# Patient Record
Sex: Female | Born: 1995 | Race: Black or African American | Hispanic: No | Marital: Single | Smoking: Never smoker
Health system: Southern US, Community
[De-identification: ages and names within clinical notes are randomized; demographics above are authoritative.]

---

## 2016-08-19 ENCOUNTER — Encounter (HOSPITAL_COMMUNITY): Payer: Self-pay | Admitting: *Deleted

## 2016-08-19 ENCOUNTER — Encounter (HOSPITAL_COMMUNITY): Payer: Self-pay

## 2016-08-19 ENCOUNTER — Emergency Department (HOSPITAL_COMMUNITY)
Admission: EM | Admit: 2016-08-19 | Discharge: 2016-08-19 | Disposition: A | Discharge status: Other Acute Inpatient Hospital | Payer: BLUE CROSS/BLUE SHIELD | Attending: Emergency Medicine | Admitting: Emergency Medicine

## 2016-08-19 ENCOUNTER — Inpatient Hospital Stay (HOSPITAL_COMMUNITY)
Admission: RE | Admit: 2016-08-19 | Discharge: 2016-08-22 | DRG: 885 | Disposition: A | Discharge status: Home / Self Care | Payer: BLUE CROSS/BLUE SHIELD | Attending: Psychiatry | Admitting: Psychiatry

## 2016-08-19 DIAGNOSIS — Z818 Family history of other mental and behavioral disorders: Secondary | ICD-10-CM

## 2016-08-19 DIAGNOSIS — T450X2A Poisoning by antiallergic and antiemetic drugs, intentional self-harm, initial encounter: Secondary | ICD-10-CM | POA: Diagnosis present

## 2016-08-19 DIAGNOSIS — F332 Major depressive disorder, recurrent severe without psychotic features: Principal | ICD-10-CM | POA: Diagnosis present

## 2016-08-19 DIAGNOSIS — R45851 Suicidal ideations: Secondary | ICD-10-CM | POA: Diagnosis present

## 2016-08-19 DIAGNOSIS — G4700 Insomnia, unspecified: Secondary | ICD-10-CM | POA: Diagnosis present

## 2016-08-19 DIAGNOSIS — F329 Major depressive disorder, single episode, unspecified: Secondary | ICD-10-CM

## 2016-08-19 DIAGNOSIS — T1491XA Suicide attempt, initial encounter: Secondary | ICD-10-CM | POA: Diagnosis present

## 2016-08-19 DIAGNOSIS — Z79899 Other long term (current) drug therapy: Secondary | ICD-10-CM | POA: Diagnosis not present

## 2016-08-19 LAB — CBC
HCT: 37.3 % (ref 36.0–46.0)
Hemoglobin: 12.2 g/dL (ref 12.0–15.0)
MCH: 27.4 pg (ref 26.0–34.0)
MCHC: 32.7 g/dL (ref 30.0–36.0)
MCV: 83.6 fL (ref 78.0–100.0)
PLATELETS: 264 10*3/uL (ref 150–400)
RBC: 4.46 MIL/uL (ref 3.87–5.11)
RDW: 12.7 % (ref 11.5–15.5)
WBC: 6.8 10*3/uL (ref 4.0–10.5)

## 2016-08-19 LAB — RAPID URINE DRUG SCREEN, HOSP PERFORMED
AMPHETAMINES: NOT DETECTED
Barbiturates: NOT DETECTED
Benzodiazepines: NOT DETECTED
Cocaine: NOT DETECTED
OPIATES: NOT DETECTED
Tetrahydrocannabinol: POSITIVE — AB

## 2016-08-19 LAB — COMPREHENSIVE METABOLIC PANEL
ALT: 11 U/L — AB (ref 14–54)
AST: 16 U/L (ref 15–41)
Albumin: 3.6 g/dL (ref 3.5–5.0)
Alkaline Phosphatase: 37 U/L — ABNORMAL LOW (ref 38–126)
Anion gap: 7 (ref 5–15)
BUN: 5 mg/dL — ABNORMAL LOW (ref 6–20)
CHLORIDE: 107 mmol/L (ref 101–111)
CO2: 25 mmol/L (ref 22–32)
CREATININE: 0.77 mg/dL (ref 0.44–1.00)
Calcium: 9.1 mg/dL (ref 8.9–10.3)
Glucose, Bld: 92 mg/dL (ref 65–99)
POTASSIUM: 4.1 mmol/L (ref 3.5–5.1)
Sodium: 139 mmol/L (ref 135–145)
Total Bilirubin: 0.4 mg/dL (ref 0.3–1.2)
Total Protein: 7 g/dL (ref 6.5–8.1)

## 2016-08-19 LAB — SALICYLATE LEVEL

## 2016-08-19 LAB — ACETAMINOPHEN LEVEL: Acetaminophen (Tylenol), Serum: <10 ug/mL — ABNORMAL LOW (ref 10–30)

## 2016-08-19 LAB — ETHANOL

## 2016-08-19 MED ORDER — MAGNESIUM HYDROXIDE 400 MG/5ML PO SUSP: 30.0000 mL | Freq: Every day | ORAL | Status: DC | PRN

## 2016-08-19 MED ORDER — ALUM & MAG HYDROXIDE-SIMETH 200-200-20 MG/5ML PO SUSP: 30.0000 mL | ORAL | Status: DC | PRN

## 2016-08-19 MED ORDER — SERTRALINE HCL 25 MG PO TABS
25.0000 mg | ORAL_TABLET | Freq: Every day | ORAL | Status: DC
  Administered 2016-08-20 – 2016-08-21 (×2): 25 mg via ORAL
  Filled 2016-08-19 (×4): qty 1

## 2016-08-19 MED ORDER — TRAZODONE HCL 50 MG PO TABS
50.0000 mg | ORAL_TABLET | Freq: Every evening | ORAL | Status: DC | PRN
  Administered 2016-08-19 – 2016-08-21 (×3): 50 mg via ORAL
  Filled 2016-08-19 (×10): qty 1

## 2016-08-19 MED ORDER — HYDROXYZINE HCL 25 MG PO TABS: 25.0000 mg | ORAL_TABLET | Freq: Four times a day (QID) | ORAL | Status: DC | PRN

## 2016-08-19 MED ORDER — ACETAMINOPHEN 325 MG PO TABS
650.0000 mg | ORAL_TABLET | Freq: Four times a day (QID) | ORAL | Status: DC | PRN
  Administered 2016-08-20: 650 mg via ORAL
  Filled 2016-08-19: qty 2

## 2016-08-19 NOTE — BH Assessment (Signed)
Assessment Note  Melinda Levine is an 20 y.o. female that present this date with thoughts of self harm with a plan to overdose. Patient is currently a Consulting civil engineerstudent at [employer] and resides with roommates off campus. Patient was transported this date by her friend/roomate Melinda Levine [phone]. Patient reports they attempted to overdose on Benadryl (patient reports taking 6 pills or more approximately two days ago) in an attempted to end her life. Patient gives consent for roommate that is present, to render collateral information and be present during the assessment. Patient is oriented to time/place but presents with a very depressed affect. Patient finds it difficult to make eye contact with this Clinical research associatewriter and speaks in a low voice. Roommate who is present, states patient has admitted several times to her in the last week that she wanted to end her life. Patient's friend states "she is not herself" and reports patient has been very depressed the last few days, not answering her door or returning calls. Patient admits to ongoing depression for "sometime" but is vague in reference to symptoms. Patient reports her depression to be at a 9 almost every day. Patient admits to increased anxiety and reports she thought she has had panic attacks in the past but never received treatment. Patient stated they were "scary episodes" when they happen. Patient reported they occur several times a month, although could not recall her last episode. Patient often stares at he floor during the assessment and is a poor historian. Patient has to be prompted by friend and this Clinical research associatewriter to answer questions. Patient states she has been very stressed over the last few weeks reporting increased episodes of crying, lack of focus (paying other students to complete her school assignments) and relationship issues with her current partner. Patient stated she hasn't been sleeping more than a few hours a night and "worries about everything." Patient  denies ever receiving any previous inpatient/outpatient treatment for any MH issues and denies any current/past SA issues. Patient denies any prior attempts/jestures self harm but does admitt to cutting herself twice in the last week reporting self inflicted "scratches" to her forearms. Patient stated she has "never" dome that before. Patient was "unsure" if she could contact for safety this date. Patient is open to a voluntary admission, therapy and possible medication management to assist with symptoms of depression. Patient denies any H/I or AVH. Case was staffed with Cobos MD who recommended an inpatient admission.           Diagnosis: MDD without psychotic features, severe GAD  Past Medical History: No past medical history on file.  No past surgical history on file.  Family History: No family history on file.  Social History:  has no tobacco, alcohol, and drug history on file.  Additional Social History:  Alcohol / Drug Use Pain Medications: See MAR Prescriptions: See MAR Over the Counter: See MAR History of alcohol / drug use?: No history of alcohol / drug abuse  CIWA: CIWA-Ar BP: 140/77 Pulse Rate: 82 COWS:    Allergies: Allergies not on file  Home Medications:  (Not in a hospital admission)  OB/GYN Status:  No LMP recorded.  General Assessment Data Location of Assessment[facility] TTS Assessment: In system Is this a Tele or Face-to-Face Assessment?: Face-to-Face Is this an Initial Assessment or a Re-assessment for this encounter?: Initial Assessment Marital status: Single Maiden name: na Is patient pregnant?: No Pregnancy Status: No Living Arrangements: Non-relatives/Friends Can pt return to current living arrangement?: Yes Admission  Status: Voluntary Is patient capable of signing voluntary admission?: Yes Referral Source: Self/Family/Friend Insurance type: Tax adviserBC/BS  Medical Screening Exam [facility] Walk-in ONLY) Medical Exam completed: Yes  Crisis Care  Plan Living Arrangements: Non-relatives/Friends Legal Guardian:  (na) Name of Psychiatrist: None Name of Therapist: None  Education Status Is patient currently in school?: Yes Current Grade:  (2nd year college) 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 : Yes Suicidal Intent: Yes-Currently Present Has patient had any suicidal intent within the past 6 months prior to admission? : Yes Is patient at risk for suicide?: Yes Suicidal Plan?: Yes-Currently Present Has patient had any suicidal plan within the past 6 months prior to admission? : No Specify Current Suicidal Plan: Overdose Access to Means: Yes Specify Access to Suicidal Means: pt has OTC medications What has been your use of drugs/alcohol within the last 12 months?: Denies Previous Attempts/Gestures: No (Hx of cutting) How many times?: 1 Other Self Harm Risks: None noted Triggers for Past Attempts: Unpredictable Intentional Self Injurious Behavior: Cutting Comment - Self Injurious Behavior: pt admitts to superficial cutting Family Suicide History: No Recent stressful life event(s): Other (Comment) (School, Relationship issues) Persecutory voices/beliefs?: No Depression: Yes Depression Symptoms: Loss of interest in usual pleasures, Feeling worthless/self pity Substance abuse history and/or treatment for substance abuse?: No Suicide prevention information given to non-admitted patients: Not applicable  Risk to Others within the past 6 months Homicidal Ideation: No Does patient have any lifetime risk of violence toward others beyond the six months prior to admission? : No Thoughts of Harm to Others: No Current Homicidal Intent: No Current Homicidal Plan: No Access to Homicidal Means: No Identified Victim:  na History of harm to others?: No Assessment of Violence: None Noted Violent Behavior Description: na Does patient have access to weapons?: No Criminal Charges Pending?: No Does patient have a court date: No Is patient on probation?: No  Psychosis Hallucinations: None noted Delusions: None noted  Mental Status Report Appearance/Hygiene: Unremarkable Eye Contact: Poor Motor Activity: Freedom of movement Speech: Soft Level of Consciousness: Quiet/awake Mood: Depressed Affect: Depressed Anxiety Level: Minimal Thought Processes: Coherent, Relevant Judgement: Unimpaired Orientation: Person, Place, Time Obsessive Compulsive Thoughts/Behaviors: None  Cognitive Functioning Concentration: Normal Memory: Recent Intact, Remote Intact IQ: Average Insight: Fair Impulse Control: Poor Appetite: Fair Weight Loss: 0 Weight Gain: 0 Sleep: Decreased Total Hours of Sleep: 5 Vegetative Symptoms: None  ADLScreening [facility] Assessment Services) Patient's cognitive ability adequate to safely complete daily activities?: Yes Patient able to express need for assistance with ADLs?: Yes Independently performs ADLs?: Yes (appropriate for developmental age)  Prior Inpatient Therapy Prior Inpatient Therapy: No Prior Therapy Dates: na Prior Therapy Facilty/Provider(s): na Reason for Treatment: na  Prior Outpatient Therapy Prior Outpatient Therapy: No Prior Therapy Dates: na Prior Therapy Facilty/Provider(s): na Reason for Treatment: na Does patient have an ACCT team?: No Does patient have Intensive In-House Services?  : No Does patient have [facility] services? : No Does patient have P4CC services?: No  ADL Screening (condition at time of admission) Patient's cognitive ability adequate to safely complete daily activities?: Yes Is the patient deaf or have difficulty hearing?: No Does the patient have difficulty seeing, even when wearing glasses/contacts?: No Does the patient have difficulty  concentrating, remembering, or making decisions?: No Patient able to  express need for assistance with ADLs?: Yes Does the patient have difficulty dressing or bathing?: No Independently performs ADLs?: Yes (appropriate for developmental age) Does the patient have difficulty walking or climbing stairs?: No Weakness of Arms/Hands: None  Home Assistive Devices/Equipment Home Assistive Devices/Equipment: None  Therapy Consults (therapy consults require a physician order) PT Evaluation Needed: No OT Evalulation Needed: No SLP Evaluation Needed: No Abuse/Neglect Assessment (Assessment to be complete while patient is alone) Physical Abuse: Denies Verbal Abuse: Denies Sexual Abuse: Denies Exploitation of patient/patient's resources: Denies Self-Neglect: Denies Values / Beliefs Cultural Requests During Hospitalization: None Spiritual Requests During Hospitalization: None Consults Spiritual Care Consult Needed: No Social Work Consult Needed: No Merchant navy officer (For Healthcare) Does Patient Have a Medical Advance Directive?: No Would patient like information on creating a medical advance directive?: No - Patient declined    Additional Information 1:1 In Past 12 Months?: No CIRT Risk: No Elopement Risk: No Does patient have medical clearance?: Yes     Disposition: Case was staffed with Cobos MD who recommended an inpatient admission.           Disposition Initial Assessment Completed for this Encounter: Yes Disposition of Patient: Inpatient treatment program Type of inpatient treatment program: Adult  On Site Evaluation by:   Reviewed with Physician:    Alfredia Ferguson 08/19/2016 2:12 PM

## 2016-08-19 NOTE — Progress Notes (Signed)
20 year old female pt admitted on voluntary basis. Melinda Levine reports feeling depressed and suicidal and did attempt overdose on benadryl recently and also reports having a plan to overdose. Melinda Levine reports having been feeling depressed since grade school but reports she has never really talked to anyone about it until she got into college. She reports that she is not on any medications at present other than an antibiotic that she reports she is taking for a cold. She does endorse marijuana use but denies everything else. She reports that she lives in off campus housing and will plan to go back there after discharge. She reports that she has to learn how to handle negative thoughts. She is able to contract for safety while in the hospital, she was oriented to unit and safety maintained.

## 2016-08-19 NOTE — ED Notes (Signed)
Per Charge patient to be medically cleared- has bed at [facility]

## 2016-08-19 NOTE — BH Assessment (Signed)
[facility] Assessment Progress Note     Case was staffed with Cobos MD who recommended an inpatient admission. [facility]

## 2016-08-19 NOTE — ED Notes (Signed)
Ordered food  

## 2016-08-19 NOTE — ED Notes (Signed)
[facility] and they will not take report until after shift change

## 2016-08-19 NOTE — ED Notes (Signed)
Patient called x3, no response 

## 2016-08-19 NOTE — ED Triage Notes (Signed)
[facility] and they sent her to us for lab work .  After her lab work  [facility].  Pt. Took Benadryl on Saturday with an attempt of hurting herself.  She denies any suicidal thoughts at this time.  She denies any pain or discomfort.

## 2016-08-19 NOTE — ED Provider Notes (Signed)
[facility]-EMERGENCY DEPT Provider Note   CSN: [id number] Arrival date & time: 08/19/16  1450     History   Chief Complaint Chief Complaint  Patient presents with  . Medical Clearance    HPI Melinda Levine is a 20 y.o. female.  [facility]. One week ago, she states she broke up with her boyfriend and has been depressed since then. She also relates problems with stress related to school. 2 days ago, she took 6 diphenhydramine capsules and also scratched her wrists. This was a suicide attempt although she now denies any suicidal ideation. She does relate a history of depression going back to middle school, but she denies prior episodes of suicidal ideation. She admits to crying spells but denies early morning wakening and anhedonia. She had been drinking and using marijuana 2 days ago, but states she normally does not to those. She denies hallucinations. She has not received treatment in the past for her depression.   The history is provided by the patient.    History reviewed. No pertinent past medical history.  There are no active problems to display for this patient.   History reviewed. No pertinent surgical history.  OB History    No data available       Home Medications    Prior to Admission medications   Medication Sig Start Date End Date Taking? Authorizing Provider  azithromycin (ZITHROMAX) 250 MG tablet Take 250 mg by mouth See admin instructions. Take two tablets by mouth on day 1 then 1 tablet by mouth on day 2 through day five. Patient states she has one more tablet left to take as of 08-19-16   Yes Historical Provider, MD  benzonatate (TESSALON) 100 MG capsule Take 100 mg by mouth 2 (two) times daily as needed for cough.    Yes Historical Provider, MD    Family History No family history on file.  Social History Social History  Substance Use Topics  . Smoking status: Never Smoker  . Smokeless  tobacco: Never Used  . Alcohol use No     Allergies   Venlafaxine   Review of Systems Review of Systems  All other systems reviewed and are negative.    Physical Exam Updated Vital Signs BP 131/77 (BP Location: Right Arm)   Pulse 73   Temp 97.9 F (36.6 C) (Oral)   Resp 17   Ht 5\' 4"  (1.626 m)   Wt 170 lb (77.1 kg)   LMP 08/04/2016   SpO2 100%   BMI 29.18 kg/m   Physical Exam  Nursing note and vitals reviewed.  20 year old female, resting comfortably and in no acute distress. Vital signs are Normal. Oxygen saturation is 100%, which is normal. Head is normocephalic and atraumatic. PERRLA, EOMI. Oropharynx is clear. Neck is nontender and supple without adenopathy or JVD. Back is nontender and there is no CVA tenderness. Lungs are clear without rales, wheezes, or rhonchi. Chest is nontender. Heart has regular rate and rhythm without murmur. Abdomen is soft, flat, nontender without masses or hepatosplenomegaly and peristalsis is normoactive. Extremities have no cyanosis or edema, full range of motion is present. Skin is warm and dry without rash. Neurologic: Mental status is normal, cranial nerves are intact, there are no motor or sensory deficits.  ED Treatments / Results  Labs (all labs ordered are listed, but only abnormal results are displayed) Labs Reviewed  COMPREHENSIVE METABOLIC PANEL - Abnormal; Notable for the  following:       Result Value   BUN 5 (*)    ALT 11 (*)    Alkaline Phosphatase 37 (*)    All other components within normal limits  ACETAMINOPHEN LEVEL - Abnormal; Notable for the following:    Acetaminophen (Tylenol), Serum <10 (*)    All other components within normal limits  RAPID URINE DRUG SCREEN, HOSP PERFORMED - Abnormal; Notable for the following:    Tetrahydrocannabinol POSITIVE (*)    All other components within normal limits  ETHANOL  SALICYLATE LEVEL  CBC  POC URINE PREG, ED    Procedures Procedures (including critical care  time)  Medications Ordered in ED Medications - No data to display   Initial Impression / Assessment and Plan / ED Course  I have reviewed the triage vital signs and the nursing notes.  Pertinent lab results that were available during my care of the patient were reviewed by me and considered in my medical decision making (see chart for details).  Clinical Course    Depression with suicidal ideation and suicide attempt with nontoxic dose of medication. She is considered medically cleared at this point, and [facility].  Final Clinical Impressions(s) / ED Diagnoses   Final diagnoses:  Reactive depression  Suicidal ideation    New Prescriptions New Prescriptions   No medications on file     Dione Boozeavid Dasani Thurlow, MD 08/19/16 1851

## 2016-08-19 NOTE — H&P (Signed)
Behavioral Health Medical Screening Exam  Melinda Levine is an 20 y.o. female who presents with her friend due to initiation of self injurious behavior and overdose of benadryl two days ago. The patient denies any previous psychiatric history. Reports recent stress of finals and break up with boyfriend about a week ago. She appears depressed through assessment stating "It's my friend who wanted me to come here. I feel fine now. I am going to quit my job and go stay with my father for a while." Patient has agreed to a voluntary admission to bed 403-1 after receiving medical clearance due to report of recent overdose.   Total Time spent with patient: 20 minutes  Psychiatric Specialty Exam: Physical Exam  Constitutional: She is oriented to person, place, and time. She appears well-developed and well-nourished.  HENT:  Head: Normocephalic and atraumatic.  Right Ear: External ear normal.  Left Ear: External ear normal.  Neck: Normal range of motion. Neck supple.  Cardiovascular: Normal rate, regular rhythm, normal heart sounds and intact distal pulses.   Respiratory: Effort normal and breath sounds normal.  GI: Soft. Bowel sounds are normal.  Musculoskeletal: Normal range of motion.  Neurological: She is alert and oriented to person, place, and time.  Skin: Skin is warm and dry.    Review of Systems  Psychiatric/Behavioral: Positive for depression and suicidal ideas. The patient is nervous/anxious.     Blood pressure 140/77, pulse 82, temperature 98.8 F (37.1 C), resp. rate 18.There is no height or weight on file to calculate BMI.  General Appearance: Casual  Eye Contact:  Good  Speech:  Clear and Coherent and Slow  Volume:  Decreased  Mood:  Depressed  Affect:  Congruent  Thought Process:  Coherent and Goal Directed  Orientation:  Full (Time, Place, and Person)  Thought Content:  Symptoms, worries, concerns   Suicidal Thoughts:  Yes.  without intent/plan  Homicidal Thoughts:  No   Memory:  Immediate;   Good Recent;   Good Remote;   Good  Judgement:  Fair  Insight:  Shallow  Psychomotor Activity:  Decreased  Concentration: Concentration: Fair and Attention Span: Fair  Recall:  [employer]Fair  Fund of Knowledge:Good  Language: Good  Akathisia:  No  Handed:  Right  AIMS (if indicated):     Assets:  Communication Skills Desire for Improvement Financial Resources/Insurance Housing Intimacy Leisure Time Physical Health Resilience Social Support  Sleep:       Musculoskeletal: Strength & Muscle Tone: within normal limits Gait & Station: normal Patient leans: N/A  Blood pressure 140/77, pulse 82, temperature 98.8 F (37.1 C), resp. rate 18.  Recommendations:  Based on my evaluation the patient does not appear to have an emergency medical condition.  Fransisca KaufmannAVIS, Devoiry Corriher, NP 08/19/2016, 2:21 PM

## 2016-08-19 NOTE — Tx Team (Signed)
Initial Treatment Plan 08/19/2016 10:06 PM Melinda Levine [id number]    PATIENT STRESSORS: Educational concerns Other: problems in relationship   PATIENT STRENGTHS: Ability for insight Average or above average intelligence Capable of independent living General fund of knowledge Motivation for treatment/growth   PATIENT IDENTIFIED PROBLEMS: Depression Suicidal thoughts "handling negative thoughts"                     DISCHARGE CRITERIA:  Ability to meet basic life and health needs Improved stabilization in mood, thinking, and/or behavior Verbal commitment to aftercare and medication compliance  PRELIMINARY DISCHARGE PLAN: Attend aftercare/continuing care group Return to previous living arrangement  PATIENT/FAMILY INVOLVEMENT: This treatment plan has been presented to and reviewed with the patient, Melinda Levine, and/or family member, .  The patient and family have been given the opportunity to ask questions and make suggestions.  Melinda Levine, Tennant[place]ok Levine, [place]RN 08/19/2016, 10:06 PM

## 2016-08-19 NOTE — ED Notes (Signed)
Voluntary consent obtained and food given to patient

## 2016-08-19 NOTE — ED Notes (Signed)
Security here to wand patient 

## 2016-08-20 DIAGNOSIS — Z79899 Other long term (current) drug therapy: Secondary | ICD-10-CM

## 2016-08-20 DIAGNOSIS — F332 Major depressive disorder, recurrent severe without psychotic features: Principal | ICD-10-CM

## 2016-08-20 LAB — PREGNANCY, URINE: Preg Test, Ur: NEGATIVE

## 2016-08-20 NOTE — [facility] Group Notes (Signed)
[facility] LCSW Group Therapy  08/20/2016   1:15 PM   Type of Therapy:  Group Therapy  Participation Level:  Active  Participation Quality:  Attentive, Sharing and Supportive  Affect:  Appropriate  Cognitive:  Alert and Oriented  Insight:  Developing/Improving and Engaged  Engagement in Therapy:  Developing/Improving and Engaged  Modes of Intervention:  Clarification, Confrontation, Discussion, Education, Exploration, Limit-setting, Orientation, Problem-solving, Rapport Building, Dance movement psychotherapisteality Testing, Socialization and Support  Summary of Progress/Problems: The topic for group therapy was feelings about diagnosis.  Pt actively participated in group discussion on their past and current diagnosis and how they feel towards this.  Pt also identified how society and family members judge them, based on their diagnosis as well as stereotypes and stigmas.  Patient engaged in a therapeutic letter writing activity expressing her desire to overcome her depression. She states that she has helped to start a support group on campus to educate other students about mental illness.   Samuella BruinKristin Jushua Waltman, MSW, LCSW Clinical Social Worker [facility] [phone]

## 2016-08-20 NOTE — [facility] Suicide Risk Assessment (Signed)
[facility]   Nursing information obtained from:   patient and chart  Demographic factors:   20 year old female , single , college Current Mental Status:   see below Loss Factors:   relationship issues , academic stressors  Historical Factors:   depression Risk Reduction Factors:   resilience   Total Time spent with patient: 45 minutes Principal Problem:  MDD Diagnosis:   Patient Active Problem List   Diagnosis Date Noted  . 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  World Science writerHealth Organization [facility](WHO). Score between 0-7:  no or low risk or alcohol related problems. Score between 8-15:  moderate risk of alcohol related problems. Score between 16-19:  high risk of alcohol related problems. Score 20 or above:  warrants further diagnostic evaluation for alcohol dependence and treatment.   CLINICAL FACTORS:   20 year old single female, college student, recent episode of self cutting and three days ago impulsive overdose on Antihistamine. Presents depressed and endorses neuro-vegetative symptoms of depression    Psychiatric Specialty Exam: Physical Exam  ROS  Blood pressure 114/68, pulse (!) 109, temperature 98.5 F (36.9 C), temperature source Oral, resp. rate 18, height 5' 5.25" (1.657 m), weight 175 lb (79.4 kg), last menstrual period 08/04/2016.Body mass index is 28.9 kg/m.  See admit note MSE   COGNITIVE FEATURES THAT CONTRIBUTE TO RISK:  Decreased level of functioning   SUICIDE RISK:   Moderate:  Frequent suicidal ideation with limited intensity, and duration, some specificity in terms of plans, no associated intent, good self-control, limited dysphoria/symptomatology, some risk factors present, and identifiable  protective factors, including available and accessible social support.   PLAN OF CARE: Patient will be admitted to inpatient psychiatric unit for stabilization and safety. Will provide and encourage milieu participation. Provide medication management and maked adjustments as needed.  Will follow daily.    I certify that inpatient services furnished can reasonably be expected to improve the patient's condition.  Nehemiah MassedOBOS, FERNANDO, MD 08/20/2016, 11:34 AM

## 2016-08-20 NOTE — H&P (Signed)
Psychiatric Admission Assessment Adult  Patient Identification: Melinda Levine MRN:  [id number] Date of Evaluation:  08/20/2016 Chief Complaint:  " I was stressed out" Principal Diagnosis: MDD, Suicide Attempt Diagnosis:   Patient Active Problem List   Diagnosis Date Noted  . MDD (major depressive disorder), recurrent episode, severe ([place]) [F33.2] 08/19/2016   History of Present Illness: 20 year old single female, college student, states she has been struggling with academic and relationship stressors. States she recently broke up with BF and had also been " pretty stressed out about my finals ". She has been having intermittent suicidal ideations and had recently been cutting self ( superficial cuts/scratches to her forearm). Three days ago she impulsively overdosed on six antihistamine tablets ( Benadryl). She states that even though she did not have further suicidal ideations, her friends were very concerned and convinced her to come to ED .  Endorses some neuro-vegetative symptoms as below Associated Signs/Symptoms: Depression Symptoms:  depressed mood, insomnia, suicidal attempt, decreased appetite, decreased sense of self esteem (Hypo) Manic Symptoms:  Denies  Anxiety Symptoms:  She reports she feels she worries excessively about stressors and describes occasional panic attacks triggered bu feeling overwhelmed , denies agoraphobia, denies social anxiety  Psychotic Symptoms: denies  PTSD Symptoms: Denies  Total Time spent with patient: 45 minutes  Past Psychiatric History: no prior psychiatric admissions, no prior suicide attempts, history of self cutting in the past ( one episode earlier this year), denies history of psychosis, denies history of mania, denies history of PTSD, occasional panic attacks . Denies history of violence .  Is the patient at risk to self? Yes.    Has the patient been a risk to self in the past 6 months? Yes.    Has the patient been a risk to self within the  distant past? No.  Is the patient a risk to others? No.  Has the patient been a risk to others in the past 6 months? No.  Has the patient been a risk to others within the distant past? No.   Prior Inpatient Therapy: Prior Inpatient Therapy: No Prior Therapy Dates: na Prior Therapy Facilty/Provider(s): na Reason for Treatment: na Prior Outpatient Therapy: Prior Outpatient Therapy: No Prior Therapy Dates: na Prior Therapy Facilty/Provider(s): na Reason for Treatment: na Does patient have an ACCT team?: No Does patient have Intensive In-House Services?  : No Does patient have [facility] services? : No Does patient have P4CC services?: No  Alcohol Screening: 1. How often do you have a drink containing alcohol?: Monthly or less 2. How many drinks containing alcohol do you have on a typical day when you are drinking?: 1 or 2 3. How often do you have six or more drinks on one occasion?: Never Preliminary Score: 0 9. Have you or someone else been injured as a result of your drinking?: No 10. Has a relative or friend or a doctor or another health worker been concerned about your drinking or suggested you cut down?: No Alcohol Use Disorder Identification Test Final Score (AUDIT): 1 Brief Intervention: AUDIT score less than 7 or less-screening does not suggest unhealthy drinking-brief intervention not indicated Substance Abuse History in the last 12 months:  Denies alcohol abuse, denies drug abuse  Consequences of Substance Abuse: Denies  Previous Psychotropic Medications: states she was prescribed Venlafaxine for migraines and depression, but states it caused  A sense of tingling so she stopped it.  In the past has been on Topamax for migraine prophylaxis. Not on any  medications recently . Psychological Evaluations:  No  Past Medical History:  History of migraines , reports allergy to Venlafaxine.  Family History: Parents alive, separated, has three brothers Family Psychiatric  History: mother  has history of depression, no suicides in family, no alcohol or drug abuse in family  Tobacco Screening: does not smoke or " vape".  Social History: single, no children, Junior in college , lives off campus with roommate, studying Business, employed as a Passenger transport manager, denies legal issues, reports recent relationship stressors with BF but states they have now broken up. History  Alcohol Use No     History  Drug Use  . Types: Marijuana    Additional Social History: Marital status: Single    Pain Medications: See MAR Prescriptions: See MAR Over the Counter: See MAR History of alcohol / drug use?: No history of alcohol / drug abuse  Allergies:   Allergies  Allergen Reactions  . Venlafaxine     Lips swell   Lab Results:  Results for orders placed or performed during the hospital encounter of 08/19/16 (from the past 48 hour(s))  Comprehensive metabolic panel     Status: Abnormal   Collection Time: 08/19/16  3:10 PM  Result Value Ref Range   Sodium 139 135 - 145 mmol/L   Potassium 4.1 3.5 - 5.1 mmol/L   Chloride 107 101 - 111 mmol/L   CO2 25 22 - 32 mmol/L   Glucose, Bld 92 65 - 99 mg/dL   BUN 5 (L) 6 - 20 mg/dL   Creatinine, Ser 0.77 0.44 - 1.00 mg/dL   Calcium 9.1 8.9 - 10.3 mg/dL   Total Protein 7.0 6.5 - 8.1 g/dL   Albumin 3.6 3.5 - 5.0 g/dL   AST 16 15 - 41 U/L   ALT 11 (L) 14 - 54 U/L   Alkaline Phosphatase 37 (L) 38 - 126 U/L   Total Bilirubin 0.4 0.3 - 1.2 mg/dL   GFR calc non Af Amer >60 >60 mL/min   GFR calc Af Amer >60 >60 mL/min    Comment: (NOTE) The eGFR has been calculated using the CKD EPI equation. This calculation has not been validated in all clinical situations. eGFR's persistently <60 mL/min signify possible Chronic Kidney Disease.    Anion gap 7 5 - 15  Ethanol     Status: None   Collection Time: 08/19/16  3:10 PM  Result Value Ref Range   Alcohol, Ethyl (B) <5 <5 mg/dL    Comment:        LOWEST DETECTABLE LIMIT FOR SERUM ALCOHOL IS 5  mg/dL FOR MEDICAL PURPOSES ONLY   Salicylate level     Status: None   Collection Time: 08/19/16  3:10 PM  Result Value Ref Range   Salicylate Lvl <7.5 2.8 - 30.0 mg/dL  Acetaminophen level     Status: Abnormal   Collection Time: 08/19/16  3:10 PM  Result Value Ref Range   Acetaminophen (Tylenol), Serum <10 (L) 10 - 30 ug/mL    Comment:        THERAPEUTIC CONCENTRATIONS VARY SIGNIFICANTLY. A RANGE OF 10-30 ug/mL MAY BE AN EFFECTIVE CONCENTRATION FOR MANY PATIENTS. HOWEVER, SOME ARE BEST TREATED AT CONCENTRATIONS OUTSIDE THIS RANGE. ACETAMINOPHEN CONCENTRATIONS >150 ug/mL AT 4 HOURS AFTER INGESTION AND >50 ug/mL AT 12 HOURS AFTER INGESTION ARE OFTEN ASSOCIATED WITH TOXIC REACTIONS.   cbc     Status: None   Collection Time: 08/19/16  3:10 PM  Result Value Ref Range   WBC  6.8 4.0 - 10.5 K/uL   RBC 4.46 3.87 - 5.11 MIL/uL   Hemoglobin 12.2 12.0 - 15.0 g/dL   HCT 37.3 36.0 - 46.0 %   MCV 83.6 78.0 - 100.0 fL   MCH 27.4 26.0 - 34.0 pg   MCHC 32.7 30.0 - 36.0 g/dL   RDW 12.7 11.5 - 15.5 %   Platelets 264 150 - 400 K/uL  Rapid urine drug screen (hospital performed)     Status: Abnormal   Collection Time: 08/19/16  3:51 PM  Result Value Ref Range   Opiates NONE DETECTED NONE DETECTED   Cocaine NONE DETECTED NONE DETECTED   Benzodiazepines NONE DETECTED NONE DETECTED   Amphetamines NONE DETECTED NONE DETECTED   Tetrahydrocannabinol POSITIVE (A) NONE DETECTED   Barbiturates NONE DETECTED NONE DETECTED    Comment:        DRUG SCREEN FOR MEDICAL PURPOSES ONLY.  IF CONFIRMATION IS NEEDED FOR ANY PURPOSE, NOTIFY LAB WITHIN 5 DAYS.        LOWEST DETECTABLE LIMITS FOR URINE DRUG SCREEN Drug Class       Cutoff (ng/mL) Amphetamine      1000 Barbiturate      200 Benzodiazepine   301 Tricyclics       601 Opiates          300 Cocaine          300 THC              50     Blood Alcohol level:  Lab Results  Component Value Date   ETH <5 09/32/3557    Metabolic Disorder  Labs:  No results found for: HGBA1C, MPG No results found for: PROLACTIN No results found for: CHOL, TRIG, HDL, CHOLHDL, VLDL, LDLCALC  Current Medications: Current Facility-Administered Medications  Medication Dose Route Frequency Provider Last Rate Last Dose  . acetaminophen (TYLENOL) tablet 650 mg  650 mg Oral Q6H PRN Laverle Hobby, PA-C      . alum & mag hydroxide-simeth (MAALOX/MYLANTA) 200-200-20 MG/5ML suspension 30 mL  30 mL Oral Q4H PRN Laverle Hobby, PA-C      . hydrOXYzine (ATARAX/VISTARIL) tablet 25 mg  25 mg Oral Q6H PRN Laverle Hobby, PA-C      . magnesium hydroxide (MILK OF MAGNESIA) suspension 30 mL  30 mL Oral Daily PRN Laverle Hobby, PA-C      . sertraline (ZOLOFT) tablet 25 mg  25 mg Oral Daily Laverle Hobby, PA-C   25 mg at 08/20/16 0834  . traZODone (DESYREL) tablet 50 mg  50 mg Oral QHS,MR X 1 Laverle Hobby, PA-C   50 mg at 08/19/16 2143   PTA Medications: Prescriptions Prior to Admission  Medication Sig Dispense Refill Last Dose  . azithromycin (ZITHROMAX) 250 MG tablet Take 250 mg by mouth See admin instructions. Take two tablets by mouth on day 1 then 1 tablet by mouth on day 2 through day five. Patient states she has one more tablet left to take as of 08-19-16   08/19/2016 at Unknown time  . benzonatate (TESSALON) 100 MG capsule Take 100 mg by mouth 2 (two) times daily as needed for cough.    08/19/2016 at Unknown time    Musculoskeletal: Strength & Muscle Tone: within normal limits Gait & Station: normal Patient leans: N/A  Psychiatric Specialty Exam: Physical Exam  Review of Systems  Constitutional: Negative.   HENT: Negative.   Eyes: Negative.   Respiratory: Negative.   Cardiovascular: Negative.  Gastrointestinal: Negative.   Genitourinary: Negative.   Musculoskeletal: Negative.   Skin: Negative.   Neurological: Positive for headaches.       History of migraines   Endo/Heme/Allergies: Negative.   Psychiatric/Behavioral: Positive for  depression and suicidal ideas.  All other systems reviewed and are negative.   Blood pressure 114/68, pulse (!) 109, temperature 98.5 F (36.9 C), temperature source Oral, resp. rate 18, height 5' 5.25" (1.657 m), weight 175 lb (79.4 kg), last menstrual period 08/04/2016.Body mass index is 28.9 kg/m.  General Appearance: Well Groomed  Eye Contact:  Good  Speech:  Normal Rate  Volume:  Decreased  Mood:  minimizes depression at this time, but does appear sad   Affect:  constricted, but reactive , smiles at times appropriately   Thought Process:  Linear  Orientation:  Full (Time, Place, and Person)  Thought Content:  denies hallucinations,no delusions  Suicidal Thoughts:  No denies any suicidal or self injurious ideations, contracts for safety on the unit, denies any homicidal or violent ideations   Homicidal Thoughts:  No  Memory:  recent and remote grossly intact  Judgement:  Other:  improved   Insight:  Fair  Psychomotor Activity:  Normal  Concentration:  Concentration: Good and Attention Span: Good  Recall:  Good  Fund of Knowledge:  Good  Language:  Good  Akathisia:  Negative  Handed:  Right  AIMS (if indicated):     Assets:  Desire for Improvement Resilience  ADL's:  Intact  Cognition:  WNL  Sleep:  Number of Hours: 6.75    Treatment Plan Summary: Daily contact with patient to assess and evaluate symptoms and progress in treatment, Medication management, Plan inpatient admission and medications as below  Observation Level/Precautions:  15 minute checks  Laboratory:  as needed - TSH, Urine Pregnancy Test   Psychotherapy:  Milieu, group therapy  Medications:  Has been started on Zoloft , 25 mgrs QDAY . We discussed side effects and rationale, have reviewed potential increased risk of suicidal ideations early in treatment with antidepressants in young adults .  Consultations:  As needed  Discharge Concerns:  -  Estimated LOS: 4-5 days  Other:     Physician Treatment  Plan for Primary Diagnosis:  MDD, Suicide Attempt Long Term Goal(s): Improvement in symptoms so as ready for discharge  Short Term Goals: Ability to verbalize feelings will improve, Ability to disclose and discuss suicidal ideas, Ability to demonstrate self-control will improve and Ability to identify and develop effective coping behaviors will improve  Physician Treatment Plan for Secondary Diagnosis: Active Problems:   MDD (major depressive disorder), recurrent episode, severe ([place])  Long Term Goal(s): Improvement in symptoms so as ready for discharge  Short Term Goals: Ability to verbalize feelings will improve, Ability to disclose and discuss suicidal ideas, Ability to demonstrate self-control will improve and Ability to identify and develop effective coping behaviors will improve  I certify that inpatient services furnished can reasonably be expected to improve the patient's condition.    Neita Garnet, MD 12/5/20179:11 AM

## 2016-08-20 NOTE — Progress Notes (Signed)
Recreation Therapy Notes  Animal-Assisted Activity (AAA) Program Checklist/Progress Notes Patient Eligibility Criteria Checklist & Daily Group note for Rec TxIntervention  Date: 12.05.2017 Time: 2:45pm Location: [address] Dayroom    AAA/T Program Assumption of Risk Form signed by Patient/ or Parent Legal Guardian Yes  Patient is free of allergies or sever asthma Yes  Patient reports no fear of animals Yes  Patient reports no history of cruelty to animals Yes  Patient understands his/her participation is voluntary Yes  Patient washes hands before animal contact Yes  Patient washes hands after animal contact Yes  Behavioral Response: Appropriate   Education:Hand Washing, Appropriate Animal Interaction   Education Outcome: Acknowledges education.   Clinical Observations/Feedback: Patient attended session and interacted appropriately with therapy dog and peers.   Hamdi Vari L Taylorann Tkach, LRT/CTRS        Dezerae Freiberger L 08/20/2016 3:04 PM 

## 2016-08-20 NOTE — Tx Team (Signed)
Interdisciplinary Treatment and Diagnostic Plan Update  08/20/2016 Time of Session: 9:30am Melinda Levine MRN: [id number]  Principal Diagnosis: MDD (major depressive disorder), recurrent episode, severe (HCC)  Current Medications:  Current Facility-Administered Medications  Medication Dose Route Frequency Provider Last Rate Last Dose  . acetaminophen (TYLENOL) tablet 650 mg  650 mg Oral Q6H PRN Kerry HoughSpencer E Simon, PA-C      . alum & mag hydroxide-simeth (MAALOX/MYLANTA) 200-200-20 MG/5ML suspension 30 mL  30 mL Oral Q4H PRN Kerry HoughSpencer E Simon, PA-C      . hydrOXYzine (ATARAX/VISTARIL) tablet 25 mg  25 mg Oral Q6H PRN Kerry HoughSpencer E Simon, PA-C      . magnesium hydroxide (MILK OF MAGNESIA) suspension 30 mL  30 mL Oral Daily PRN Kerry HoughSpencer E Simon, PA-C      . sertraline (ZOLOFT) tablet 25 mg  25 mg Oral Daily Kerry HoughSpencer E Simon, PA-C   25 mg at 08/20/16 0834  . traZODone (DESYREL) tablet 50 mg  50 mg Oral QHS,MR X 1 Kerry HoughSpencer E Simon, PA-C   50 mg at 08/19/16 2143   PTA Medications: Prescriptions Prior to Admission  Medication Sig Dispense Refill Last Dose  . azithromycin (ZITHROMAX) 250 MG tablet Take 250 mg by mouth See admin instructions. Take two tablets by mouth on day 1 then 1 tablet by mouth on day 2 through day five. Patient states she has one more tablet left to take as of 08-19-16   08/19/2016 at Unknown time  . benzonatate (TESSALON) 100 MG capsule Take 100 mg by mouth 2 (two) times daily as needed for cough.    08/19/2016 at Unknown time    Patient Stressors: Educational concerns Other: problems in relationship  Patient Strengths: Ability for insight Average or above average intelligence Capable of independent living [employer] knowledge Motivation for treatment/growth  Treatment Modalities: Medication Management, Group therapy, Case management,  1 to 1 session with clinician, Psychoeducation, Recreational therapy.   Physician Treatment Plan for Primary Diagnosis: MDD (major depressive  disorder), recurrent episode, severe (HCC) Long Term Goal(s): Improvement in symptoms so as ready for discharge Improvement in symptoms so as ready for discharge   Short Term Goals: Ability to verbalize feelings will improve Ability to disclose and discuss suicidal ideas Ability to demonstrate self-control will improve Ability to identify and develop effective coping behaviors will improve Ability to verbalize feelings will improve Ability to disclose and discuss suicidal ideas Ability to demonstrate self-control will improve Ability to identify and develop effective coping behaviors will improve  Medication Management: Evaluate patient's response, side effects, and tolerance of medication regimen.  Therapeutic Interventions: 1 to 1 sessions, Unit Group sessions and Medication administration.  Evaluation of Outcomes: Progressing   RN Treatment Plan for Primary Diagnosis: MDD (major depressive disorder), recurrent episode, severe (HCC) Long Term Goal(s): Knowledge of disease and therapeutic regimen to maintain health will improve  Short Term Goals: Ability to remain free from injury will improve, Ability to disclose and discuss suicidal ideas, Ability to identify and develop effective coping behaviors will improve and Compliance with prescribed medications will improve  Medication Management: RN will administer medications as ordered by provider, will assess and evaluate patient's response and provide education to patient for prescribed medication. RN will report any adverse and/or side effects to prescribing provider.  Therapeutic Interventions: 1 on 1 counseling sessions, Psychoeducation, Medication administration, Evaluate responses to treatment, Monitor vital signs and CBGs as ordered, Perform/monitor CIWA, COWS, AIMS and Fall Risk screenings as ordered, Perform wound care treatments as  ordered.  Evaluation of Outcomes: Progressing   LCSW Treatment Plan for Primary Diagnosis: MDD  (major depressive disorder), recurrent episode, severe (HCC) Long Term Goal(s): Safe transition to appropriate next level of care at discharge, Engage patient in therapeutic group addressing interpersonal concerns.  Short Term Goals: Engage patient in aftercare planning with referrals and resources, Increase social support, Increase emotional regulation, Identify triggers associated with mental health/substance abuse issues and Increase skills for wellness and recovery  Therapeutic Interventions: Assess for all discharge needs, 1 to 1 time with Social worker, Explore available resources and support systems, Assess for adequacy in community support network, Educate family and significant other(s) on suicide prevention, Complete Psychosocial Assessment, Interpersonal group therapy.  Evaluation of Outcomes: Progressing   Progress in Treatment :  Attending groups: Continuing to assess  Participating in groups: Continuing to assess  Taking medication as prescribed: Yes, MD continuing to assess for appropriate medication regimen  Toleration medication: Yes  Family/Significant other contact made: Treatment team assessing for appropriate contacts  Patient understands diagnosis: Yes  Discussing patient identified problems/goals with staff: Yes  Medical problems stabilized or resolved: Yes  Denies suicidal/homicidal ideation: Yes, denies  Issues/concerns per patient self-inventory: None reported  Other: N/A  New problem(s) identified: None reported at this time    New Short Term/Long Term Goal(s): None at this time    Discharge Plan or Barriers: Patient plans to return home to follow up with outpatient services.     Reason for Continuation of Hospitalization: Anxiety Depression Medication stabilization Suicidal Ideations Withdrawal symptoms  Estimated Length of Stay: 1-2 days    Attendees:  Patient:   Physician: Dr. Jama Flavorsobos, Dr. Mckinley Jewelates, MD  08/20/2016   9:30am  Nursing:  Shelia MediaJanet Jones, Quintella ReichertBeverly Knight, Odette FractionCaroline Beaudry, Karen [facility] Campushugart 08/20/2016 9:30am  RN Care Manager: Onnie BoerJennifer Clark, CM  08/20/2016 9:30am  Social Workers: Chad CordialLauren Carter, LCSW, Samuella BruinKristin Rozell Theiler, LCSW, Heather Smart, LCSW   08/20/2016 9:30am  Nurse Pratictioners: Gray BernhardtMay Augustin, NP, NP 08/20/2016 9:30am  Other:  08/20/2016 9:30am   Scribe for Treatment Team: Samuella BruinKristin Yurem Viner, LCSW Clinical Social Worker [facility] [phone]

## 2016-08-20 NOTE — [facility] Group Notes (Signed)
Orientation group:  Patient listened attentively.  She is concerned about her exams.  She states she needs a letter upon discharge.  She hopes to speak with the social worker about calling the school to let them know she is in the hospital.

## 2016-08-20 NOTE — Progress Notes (Signed)
Adult Psychoeducational Group Note  Date:  08/20/2016 Time:  11:27 PM  Group Topic/Focus:  Wrap-Up Group:   The focus of this group is to help patients review their daily goal of treatment and discuss progress on daily workbooks.   Participation Level:  Active  Participation Quality:  Appropriate  Affect:  Appropriate  Cognitive:  Alert  Insight: Appropriate  Engagement in Group:  Engaged  Modes of Intervention:  Discussion  Additional Comments:  Patient states, "My day was good". Patient stated that she wrote a letter to depression today. Patient's goal for today was to process her negative thoughts better.  Renelle Stegenga L Lakshmi Sundeen 08/20/2016, 11:27 PM

## 2016-08-20 NOTE — [facility] Suicide Risk Assessment (Signed)
[facility] INPATIENT:  Family/Significant Other Suicide Prevention Education  Suicide Prevention Education:  Contact Attempts: mother Aldean Jewettera Taybron [phone], (name of family member/significant other) has been identified by the patient as the family member/significant other with whom the patient will be residing, and identified as the person(s) who will aid the patient in the event of a mental health crisis.  With written consent from the patient, two attempts were made to provide suicide prevention education, prior to and/or following the patient's discharge.  We were unsuccessful in providing suicide prevention education.  A suicide education pamphlet was given to the patient to share with family/significant other.  Date and time of first attempt: 08/20/16 at 5pm Date and time of second attempt: 08/21/16 at 11:30am  Collyn Ribas L Kiwanna Spraker 08/20/2016, 5:04 PM

## 2016-08-20 NOTE — [facility] Counselor (Signed)
Adult Comprehensive Assessment  Patient ID: Melinda Levine, female   DOB: 12/24/1995, 20 y.o.   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 Family Relationships: Gets along well with family but feels compelled to financially and emotionally support them Financial / Lack of resources (include bankruptcy): Some financial stress due to sending money back home to help her family Housing / Lack of housing: Lives in off-campus apartment with a roommate who she gets along well with Physical health (include injuries & life threatening diseases): Frequent migraines Social relationships: Denies Substance abuse: Denies Bereavement / Loss: Break up last week of a 3 yr relationship  Living/Environment/Situation:  Living Arrangements: Non-relatives/Friends Living conditions (as described by patient or guardian): Lives in off-campus apartment with a roommate who she gets along well with How long has patient lived in current situation?: August 2017 What is atmosphere in current home: Comfortable  Family History:  Marital status: Single Does patient have children?: No  Childhood History:  By whom was/is the patient raised?: Both parents Description of patient's relationship with caregiver when they were a child: Got along well with both parents Patient's description of current relationship with people who raised him/her: Gets along well with family but feels compelled to financially and emotionally support them Does patient have siblings?: Yes Number of Siblings: 3 Description of patient's current relationship with siblings: good with 3 brothers Did patient suffer any verbal/emotional/physical/sexual abuse as a child?: No Did patient suffer from  severe childhood neglect?: No Has patient ever been sexually abused/assaulted/raped as an adolescent or adult?: No Was the patient ever a victim of a crime or a disaster?: No Witnessed domestic violence?: No Has patient been effected by domestic violence as an adult?: No  Education:  Highest grade of school patient has completed: Holiday representativeJunior at [employer] studying business.  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 Patient's job has been impacted by current illness: No What is the longest time patient has a held a job?: current job Has patient ever been in the [employer] and Companymilitary?: No  Financial Resources:   Surveyor, quantityinancial resources: Income from employment Does patient have a representative payee or guardian?: No  Alcohol/Substance Abuse:   What has been your use of drugs/alcohol within the last 12 months?: Denies If attempted suicide, did drugs/alcohol play a role in this?: No Alcohol/Substance Abuse Treatment Hx: Denies past history Has alcohol/substance abuse ever caused legal problems?: No  Social Support System:   Patient's Community Support System: Good Describe Community Support System: friends and family Type of faith/religion: Ephri[employer] KnucklesChristian How does patient's faith help to cope with current illness?: prays often  Leisure/Recreation:   Leisure and Hobbies: Research officer, trade unionstyling hair, watching beauty videos online  Strengths/Needs:   What things does the patient do well?: good sense of humor, supporting others, styling hair In what areas does patient struggle / problems for patient: depression, anxiety,  dealing with recent break up, not doing well in classes  Discharge Plan:  Does patient have access to transportation?: Yes Will patient be returning to same living situation after discharge?: Yes Currently receiving community mental health services: Yes (From Whom) ([facility]- does not want to return) If no, would patient like referral for services when discharged?: Yes (What county?) ([place] or surrounding counties) Does patient have financial barriers related to discharge medications?: No  Summary/Recommendations:     Patient is a 20 year old female who presented to the hospital with an intentional overdose of medications. Primary triggers for admission include increased depression, declining academic performance, and a recent breakup. Patient will benefit from crisis stabilization, medication evaluation, group therapy and psycho education in addition to case management for discharge planning. At discharge, it is recommended that Pt remain compliant with established discharge plan and continued treatment.   Jye Fariss L Juliannah Ohmann. 08/20/2016

## 2016-08-21 LAB — TSH: TSH: 1.922 u[IU]/mL (ref 0.350–4.500)

## 2016-08-21 MED ORDER — MENTHOL 3 MG MT LOZG: 1.0000 | LOZENGE | OROMUCOSAL | Status: DC | PRN

## 2016-08-21 MED ORDER — SERTRALINE HCL 50 MG PO TABS
50.0000 mg | ORAL_TABLET | Freq: Every day | ORAL | Status: DC
  Administered 2016-08-22: 50 mg via ORAL
  Filled 2016-08-21 (×4): qty 1

## 2016-08-21 NOTE — [facility] Group Notes (Signed)
[facility] LCSW Group Therapy 08/21/2016  1:15 PM Type of Therapy: Group Therapy Participation Level: Active  Participation Quality: Attentive, Sharing and Supportive  Affect: Appropriate  Cognitive: Alert and Oriented  Insight: Developing/Improving and Engaged  Engagement in Therapy: Developing/Improving and Engaged  Modes of Intervention: Clarification, Confrontation, Discussion, Education, Exploration, Limit-setting, Orientation, Problem-solving, Rapport Building, Dance movement psychotherapisteality Testing, Socialization and Support  Summary of Progress/Problems: The topic for group today was emotional regulation. This group focused on both positive and negative emotion identification and allowed group members to process ways to identify feelings, regulate negative emotions, and find healthy ways to manage internal/external emotions. Group members were asked to reflect on a time when their reaction to an emotion led to a negative outcome and explored how alternative responses using emotion regulation would have benefited them. Group members were also asked to discuss a time when emotion regulation was utilized when a negative emotion was experienced. Patient shared that she finds comfort in a stuffed animal that her grandmother gave her, stating that it makes her think of being carefree when she was younger.   Samuella BruinKristin Ezra Denne, MSW, LCSW Clinical Social Worker [facility] [phone]

## 2016-08-21 NOTE — Progress Notes (Signed)
Patient ID: Melinda Levine, female   DOB: 03/15/1996, 20 y.o.   MRN: [id number]  DAR: Pt. Denies SI/HI and A/V Hallucinations. She reports sleep is good, appetite is good, energy level is normal, and concentration is good. She rates depression 0/10, hopelessness 0/10, and anxiety 2/10. Patient does not report any pain or discomfort at this time. Support and encouragement provided to the patient. Scheduled medication administered to patient per physician's order. Patient is minimal with Clinical research associatewriter but is seen in the milieu interacting with peers and is attending groups. She is cooperative and plan of care continues. Q15 minute checks are maintained for safety.

## 2016-08-21 NOTE — Progress Notes (Signed)
Pt reports she is doing much better today, and denies SI/HI/AVH.  She is hopeful that she will be discharged soon so that she can get back to school ([employer]) because of semester exams.  She has been pleasant and cooperative on the unit.  She has been observed interacting with other patients.  Support and encouragement offered.  Discharge plans are in process.  Safety maintained with q15 minute checks.

## 2016-08-21 NOTE — Progress Notes (Signed)
Pt reports she is doing fine this evening.  She has been observed most of the evening in the dayroom talking with a female peer close to her age.  She denies SI/HI/AVH.  She is eager to discharge so that she can get back to school.  Pt voices no needs or concerns at this time.  She makes her needs known to staff.  She has been pleasant and cooperative.  Support and encouragement offered.  Discharge plans are in process.  Safety maintained with q15 minute checks.

## 2016-08-21 NOTE — Progress Notes (Signed)
[facility] MD Progress Note  08/21/2016 4:55 PM Melinda Levine  MRN:  [id number] Subjective:  Patient reports improving mood, and describes feeling " better". At this time does not endorse medication side effects. Objective : I have discussed case with treatment team and have met with patient . Patient reports ongoing depression, but states she is feeling better,and does present with improved , brighter range of affect. No medication side effects at this time. Denies any suicidal or self injurious ideations at this time. Visible on the unit, more interactive/ sociable  with peers of about her age  No agitation or restlessness . Patient states she is less focused on recent break up at this time, and states " I am getting over it", she does report worrying significantly about her academic performance and grades , and states this time of year ( final exams ) causes increased tension and anxiety. Labs - Pregnancy test negative , TSH WNL  Principal Problem: MDD (major depressive disorder), recurrent episode, severe (HCC) Diagnosis:   Patient Active Problem List   Diagnosis Date Noted  . MDD (major depressive disorder), recurrent episode, severe (HCC) [F33.2] 08/19/2016   Total Time spent with patient: 20 minutes   Past Medical History: History reviewed. No pertinent past medical history. History reviewed. No pertinent surgical history. Family History: History reviewed. No pertinent family history.  Social History:  History  Alcohol Use No     History  Drug Use  . Types: Marijuana    Social History   Social History  . Marital status: Single    Spouse name: N/A  . Number of children: N/A  . Years of education: N/A   Social History Main Topics  . Smoking status: Never Smoker  . Smokeless tobacco: Never Used  . Alcohol use No  . Drug use:     Types: Marijuana  . Sexual activity: Not Asked   Other Topics Concern  . None   Social History Narrative  . None   Additional Social  History:    Pain Medications: See MAR Prescriptions: See MAR Over the Counter: See MAR History of alcohol / drug use?: No history of alcohol / drug abuse  Sleep: Good  Appetite:  Good  Current Medications: Current Facility-Administered Medications  Medication Dose Route Frequency Provider Last Rate Last Dose  . acetaminophen (TYLENOL) tablet 650 mg  650 mg Oral Q6H PRN Spencer E Simon, PA-C   650 mg at 08/20/16 1536  . alum & mag hydroxide-simeth (MAALOX/MYLANTA) 200-200-20 MG/5ML suspension 30 mL  30 mL Oral Q4H PRN Spencer E Simon, PA-C      . hydrOXYzine (ATARAX/VISTARIL) tablet 25 mg  25 mg Oral Q6H PRN Spencer E Simon, PA-C      . magnesium hydroxide (MILK OF MAGNESIA) suspension 30 mL  30 mL Oral Daily PRN Spencer E Simon, PA-C      . menthol-cetylpyridinium (CEPACOL) lozenge 3 mg  1 lozenge Oral PRN Agnes I Nwoko, NP      . sertraline (ZOLOFT) tablet 25 mg  25 mg Oral Daily Spencer E Simon, PA-C   25 mg at 08/21/16 0756  . traZODone (DESYREL) tablet 50 mg  50 mg Oral QHS,MR X 1 Spencer E Simon, PA-C   50 mg at 08/20/16 2105    Lab Results:  Results for orders placed or performed during the hospital encounter of 08/19/16 (from the past 48 hour(s))  Pregnancy, urine     Status: None   Collection Time: 08/20/16  9:39 AM    Result Value Ref Range   Preg Test, Ur NEGATIVE NEGATIVE    Comment:        THE SENSITIVITY OF THIS METHODOLOGY IS >20 mIU/mL. Performed at [facility]   TSH     Status: None   Collection Time: 08/21/16  6:03 AM  Result Value Ref Range   TSH 1.922 0.350 - 4.500 uIU/mL    Comment: Performed by a 3rd Generation assay with a functional sensitivity of <=0.01 uIU/mL. [facility]     Blood Alcohol level:  Lab Results  Component Value Date   ETH <5 08/19/2016    Metabolic Disorder Labs: No results found for: HGBA1C, MPG No results found for: PROLACTIN No results found for: CHOL, TRIG, HDL, CHOLHDL,  VLDL, LDLCALC  Physical Findings: AIMS: Facial and Oral Movements Muscles of Facial Expression: None, normal Lips and Perioral Area: None, normal Jaw: None, normal Tongue: None, normal,Extremity Movements Upper (arms, wrists, hands, fingers): None, normal Lower (legs, knees, ankles, toes): None, normal, Trunk Movements Neck, shoulders, hips: None, normal, Overall Severity Severity of abnormal movements (highest score from questions above): None, normal Incapacitation due to abnormal movements: None, normal Patient's awareness of abnormal movements (rate only patient's report): No Awareness, Dental Status Current problems with teeth and/or dentures?: No Does patient usually wear dentures?: No  CIWA:    COWS:     Musculoskeletal: Strength & Muscle Tone: within normal limits Gait & Station: normal Patient leans: N/A  Psychiatric Specialty Exam: Physical Exam  ROS denies headache, no nausea or vomiting   Blood pressure 111/84, pulse 83, temperature 98.4 F (36.9 C), temperature source Oral, resp. rate 16, height 5' 5.25" (1.657 m), weight 175 lb (79.4 kg), last menstrual period 08/04/2016.Body mass index is 28.9 kg/m.  General Appearance: Well Groomed  Eye Contact:  Good  Speech:  Normal Rate  Volume:  Normal  Mood:  partially improved, less depressed   Affect:  more reactive, less constricted  Thought Process:  Linear  Orientation:  Full (Time, Place, and Person)  Thought Content:  no hallucinations,no delusions, not internally preoccupied   Suicidal Thoughts:  No  Homicidal Thoughts:  No  Memory:  recent and remote grossly intact   Judgement:  Other:  improving   Insight:  improving   Psychomotor Activity:  Normal  Concentration:  Concentration: Good and Attention Span: Good  Recall:  Good  Fund of Knowledge:  Good  Language:  Good  Akathisia:  Negative  Handed:  Right  AIMS (if indicated):     Assets:  Desire for Improvement Resilience  ADL's:  Intact  Cognition:   WNL  Sleep:  Number of Hours: 6   Assessment - patient is improving compared to admission, less depressed, and more reactive affect, although  remains vaguely constricted .Denies any ongoing suicidal or self injurious ideations, and is more interactive , visible in milieu. Thus far tolerating Zoloft trial well .    Treatment Plan Summary: Daily contact with patient to assess and evaluate symptoms and progress in treatment, Medication management, Plan inpatient admission  and medications as below Encourage ongoing group and milieu participation to work on coping skills and symptom reduction Increase Zoloft to 50 mgrs QDAY for depression, anxiety Continue Vistaril 25 mgrs Q 6 hours PRN for anxiety  Continue Trazodone 50 mgrs QHS PRN for insomnia Treatment team working on disposition planning options  COBOS, FERNANDO, MD 08/21/2016, 4:55 PM 

## 2016-08-21 NOTE — [facility] Suicide Risk Assessment (Signed)
[facility] INPATIENT:  Family/Significant Other Suicide Prevention Education  Suicide Prevention Education:  Education Completed; mother Melinda Levine [phone],  (name of family member/significant other) has been identified by the patient as the family member/significant other with whom the patient will be residing, and identified as the person(s) who will aid the patient in the event of a mental health crisis (suicidal ideations/suicide attempt).  With written consent from the patient, the family member/significant other has been provided the following suicide prevention education, prior to the and/or following the discharge of the patient.  The suicide prevention education provided includes the following:  Suicide risk factors  Suicide prevention and interventions  National Suicide Hotline telephone number  [facility] assessment telephone number  [facility] Emergency Assistance 911  [facility] and/or [facility]idential Mobile Crisis Unit telephone number  Request made of family/significant other to:  Remove weapons (e.g., guns, rifles, knives), all items previously/currently identified as safety concern.    Remove drugs/medications (over-the-counter, prescriptions, illicit drugs), all items previously/currently identified as a safety concern.  The family member/significant other verbalizes understanding of the suicide prevention education information provided.  The family member/significant other agrees to remove the items of safety concern listed above.  Melinda Levine 08/21/2016, 4:13 PM

## 2016-08-21 NOTE — Progress Notes (Signed)
Recreation Therapy Notes  Date: 08/21/16 Time: 0930 Location: 300 Dayroom  Group Topic: Stress Management  Goal Area(s) Addresses:  Patient will verbalize importance of using healthy stress management.  Patient will identify positive emotions associated with healthy stress management.   Behavioral Response: Engaged  Intervention: Stress Management  Activity :  Guided Imagery.  LRT introduced the stress management technique of guided imagery to the patients.  LRT read a script to allow patients to envision their peaceful place.  Patients were to follow allow with the script to engage in the activity.    Education:  Stress Management, Discharge Planning.   Education Outcome: Acknowledges edcuation/In group clarification offered/Needs additional education  Clinical Observations/Feedback: Pt attended group.   Caroll RancherMarjette Kaianna Dolezal, LRT/CTRS         Caroll RancherLindsay, Adah Stoneberg A 08/21/2016 12:30 PM

## 2016-08-21 NOTE — Progress Notes (Signed)
Adult Psychoeducational Group Note  Date:  08/21/2016 Time:  8:37 PM  Group Topic/Focus:  Wrap-Up Group:   The focus of this group is to help patients review their daily goal of treatment and discuss progress on daily workbooks.   Participation Level:  Active  Participation Quality:  Appropriate and Attentive  Affect:  Appropriate  Cognitive:  Appropriate  Insight: Appropriate and Good  Engagement in Group:  Engaged  Modes of Intervention:  Discussion  Additional Comments:  Pt stated her goal for today was to find things to do after discharge. Pt stated she wrote in her journal things that she wanted to accomplish such as getting things with her classes straight since she missed her final exams, and using her coping skills upon discharge. Pt stated something positive for her today is she is hoping to discharge tomorrow. Pt was encouraged to make her needs known to staff. Caswell CorwinOwen, Jarred Purtee C 08/21/2016, 8:37 PM

## 2016-08-22 MED ORDER — SERTRALINE HCL 50 MG PO TABS: 50.0000 mg | ORAL_TABLET | Freq: Every day | ORAL | 0 refills | Status: AC

## 2016-08-22 MED ORDER — TRAZODONE HCL 50 MG PO TABS: 50.0000 mg | ORAL_TABLET | Freq: Every evening | ORAL | 0 refills | Status: AC | PRN

## 2016-08-22 MED ORDER — HYDROXYZINE HCL 25 MG PO TABS: 25.0000 mg | ORAL_TABLET | Freq: Four times a day (QID) | ORAL | 0 refills | Status: AC | PRN

## 2016-08-22 NOTE — Discharge Summary (Signed)
Physician Discharge Summary Note  Patient:  Melinda Levine is an 20 y.o., female MRN:  [id number] DOB:  03/03/1996 Patient phone:  [phone] (home)  Patient address:   [address] [place] [address],  Total Time spent with patient: 30 minutes  Date of Admission:  08/19/2016 Date of Discharge: 08/22/2016  Reason for Admission:    Principal Problem: Severe episode of recurrent major depressive disorder, without psychotic features [facility](HCC) Discharge Diagnoses: Patient Active Problem List   Diagnosis Date Noted  . Severe episode of recurrent major depressive disorder, without psychotic features (HCC) [F33.2] 08/19/2016    Past Psychiatric History: see HPI   Past Medical History: History reviewed. No pertinent past medical history. History reviewed. No pertinent surgical history. Family History: History reviewed. No pertinent family history. Family Psychiatric  History:  See HPI Social History:  History  Alcohol Use No     History  Drug Use  . Types: Marijuana    Social History   Social History  . Marital status: Single    Spouse name: N/A  . Number of children: N/A  . Years of education: N/A   Social History Main Topics  . Smoking status: Never Smoker  . Smokeless tobacco: Never Used  . Alcohol use No  . Drug use:     Types: Marijuana  . Sexual activity: Not Asked   Other Topics Concern  . None   Social History Narrative  . 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  She reports that her stressors included breaking up with boyfriend, and struggling with academic studies.  She overdosed on 6 Benadryl pills.  Her friends were concerned about her and encouraged her to seek help.  Melinda Levine was admitted for Severe episode of recurrent major depressive disorder, without psychotic features (HCC) and crisis management.  Patient was treated with medications with their indications  listed below in detail under Medication List.  Medical problems were identified and treated as needed.  Home medications were restarted as appropriate.  Improvement was monitored by observation and Melinda Levine daily report of symptom reduction.  Emotional and mental status was monitored by daily self inventory reports completed by Melinda Levine and clinical staff.  Patient reported continued improvement, denied any new concerns.  Patient had been compliant on medications and denied side effects.  Support and encouragement was provided.    Melinda Levine was evaluated by the treatment team for stability and plans for continued recovery upon discharge.  Patient was offered further treatment options upon discharge including Residential, Intensive Outpatient and Outpatient treatment. Patient will follow up with agency listed below for medication management and counseling.  Encouraged patient to maintain satisfactory support network and home environment.  Advised to adhere to medication compliance and outpatient treatment follow up.  Prescriptions provided.       Melinda Levine motivation was an integral factor for scheduling further treatment.  Employment, transportation, bed availability, health status, family support, and any pending legal issues were also considered during patient's hospital stay.  Upon completion of this admission the patient was both mentally and medically stable for discharge denying suicidal/homicidal ideation, auditory/visual/tactile hallucinations, delusional thoughts and paranoia.      Physical Findings: AIMS: Facial and Oral Movements Muscles of Facial Expression: None, normal Lips and Perioral Area: None, normal Jaw: None, normal Tongue: None, normal,Extremity Movements Upper (arms, wrists, hands, fingers): None, normal Lower (legs, knees, ankles, toes): None, normal, Trunk  Movements Neck, shoulders, hips: None, normal, Overall Severity Severity of abnormal movements (highest score  from questions above): None, normal Incapacitation due to abnormal movements: None, normal Patient's awareness of abnormal movements (rate only patient's report): No Awareness, Dental Status Current problems with teeth and/or dentures?: No Does patient usually wear dentures?: No  CIWA:    COWS:     Musculoskeletal: Strength & Muscle Tone: within normal limits Gait & Station: normal Patient leans: N/A  Psychiatric Specialty Exam: Physical Exam  Nursing note and vitals reviewed. Psychiatric: She has a normal mood and affect. Her speech is normal and behavior is normal. Judgment and thought content normal. Cognition and memory are normal.    Review of Systems  Constitutional: Negative.   HENT: Negative.   Eyes: Negative.   Respiratory: Negative.   Cardiovascular: Negative.   Gastrointestinal: Negative.   Genitourinary: Negative.   Musculoskeletal: Negative.   Skin: Negative.   Neurological: Negative.   Endo/Heme/Allergies: Negative.   Psychiatric/Behavioral: Negative.     Blood pressure 105/65, pulse (!) 122, temperature 98.4 F (36.9 C), temperature source Oral, resp. rate 16, height 5' 5.25" (1.657 m), weight 79.4 kg (175 lb), last menstrual period 08/04/2016.Body mass index is 28.9 kg/m.   Have you used any form of tobacco in the last 30 days? (Cigarettes, Smokeless Tobacco, Cigars, and/or Pipes): No  Has this patient used any form of tobacco in the last 30 days? (Cigarettes, Smokeless Tobacco, Cigars, and/or Pipes) Yes, N/A  Blood Alcohol level:  Lab Results  Component Value Date   ETH <5 08/19/2016    Metabolic Disorder Labs:  No results found for: HGBA1C, MPG No results found for: PROLACTIN No results found for: CHOL, TRIG, HDL, CHOLHDL, VLDL, LDLCALC  See Psychiatric Specialty Exam and Suicide Risk Assessment completed by Attending Physician prior to discharge.  Discharge destination:  Home  Is patient on multiple antipsychotic therapies at discharge:  No    Has Patient had three or more failed trials of antipsychotic monotherapy by history:  No  Recommended Plan for Multiple Antipsychotic Therapies: NA     Medication List    STOP taking these medications   azithromycin 250 MG tablet Commonly known as:  ZITHROMAX   benzonatate 100 MG capsule Commonly known as:  TESSALON     TAKE these medications     Indication  hydrOXYzine 25 MG tablet Commonly known as:  ATARAX/VISTARIL Take 1 tablet (25 mg total) by mouth every 6 (six) hours as needed for anxiety.  Indication:  Anxiety Neurosis   sertraline 50 MG tablet Commonly known as:  ZOLOFT Take 1 tablet (50 mg total) by mouth daily. Start taking on:  08/23/2016  Indication:  Major Depressive Disorder   traZODone 50 MG tablet Commonly known as:  DESYREL Take 1 tablet (50 mg total) by mouth at bedtime and may repeat dose one time if needed.  Indication:  Major Depressive Disorder      Follow-up Information    [facility] Follow up.   Why:  Therapy appt with Jari Pigg on Thursday 12/14 at 9am. Medication managment appt with Milana Obey at 2:45pm. Please bring insurance card to appt and call office if you need to reschedule.  Contact information: [address] [place], [place] [address]  [phone]          Follow-up recommendations:  Activity:  as tol Diet:  as tol  Comments:  1.  Take all your medications as prescribed.   2.  Report any adverse  side effects to outpatient provider. 3.  Patient instructed to not use alcohol or illegal drugs while on prescription medicines. 4.  In the event of worsening symptoms, instructed patient to call 911, the crisis hotline or go to nearest emergency room for evaluation of symptoms.  Signed: Lindwood QuaSheila May Agustin, NP [facility] 08/22/2016, 2:15 PM   Patient seen, Suicide Assessment Completed.  Disposition Plan Reviewed

## 2016-08-22 NOTE — Tx Team (Signed)
Interdisciplinary Treatment and Diagnostic Plan Update  08/22/2016 Time of Session: 9:30am Melinda Levine MRN: [id number]  Principal Diagnosis: MDD (major depressive disorder), recurrent episode, severe (HCC)  Current Medications:  Current Facility-Administered Medications  Medication Dose Route Frequency Provider Last Rate Last Dose  . acetaminophen (TYLENOL) tablet 650 mg  650 mg Oral Q6H PRN Kerry HoughSpencer E Simon, PA-C   650 mg at 08/20/16 1536  . alum & mag hydroxide-simeth (MAALOX/MYLANTA) 200-200-20 MG/5ML suspension 30 mL  30 mL Oral Q4H PRN Kerry HoughSpencer E Simon, PA-C      . hydrOXYzine (ATARAX/VISTARIL) tablet 25 mg  25 mg Oral Q6H PRN Kerry HoughSpencer E Simon, PA-C      . magnesium hydroxide (MILK OF MAGNESIA) suspension 30 mL  30 mL Oral Daily PRN Kerry HoughSpencer E Simon, PA-C      . menthol-cetylpyridinium (CEPACOL) lozenge 3 mg  1 lozenge Oral PRN Sanjuana KavaAgnes I Nwoko, NP      . sertraline (ZOLOFT) tablet 50 mg  50 mg Oral Daily Craige CottaFernando A Cobos, MD   50 mg at 08/22/16 0747  . traZODone (DESYREL) tablet 50 mg  50 mg Oral QHS,MR X 1 Kerry HoughSpencer E Simon, PA-C   50 mg at 08/21/16 2133   PTA Medications: Prescriptions Prior to Admission  Medication Sig Dispense Refill Last Dose  . azithromycin (ZITHROMAX) 250 MG tablet Take 250 mg by mouth See admin instructions. Take two tablets by mouth on day 1 then 1 tablet by mouth on day 2 through day five. Patient states she has one more tablet left to take as of 08-19-16   08/19/2016 at Unknown time  . benzonatate (TESSALON) 100 MG capsule Take 100 mg by mouth 2 (two) times daily as needed for cough.    08/19/2016 at Unknown time    Patient Stressors: Educational concerns Other: problems in relationship  Patient Strengths: Ability for insight Average or above average intelligence Capable of independent living [employer] Motivation for treatment/growth  Treatment Modalities: Medication Management, Group therapy, Case management,  1 to 1 session with clinician,  Psychoeducation, Recreational therapy.   Physician Treatment Plan for Primary Diagnosis: MDD (major depressive disorder), recurrent episode, severe (HCC) Long Term Goal(s): Improvement in symptoms so as ready for discharge Improvement in symptoms so as ready for discharge   Short Term Goals: Ability to verbalize feelings will improve Ability to disclose and discuss suicidal ideas Ability to demonstrate self-control will improve Ability to identify and develop effective coping behaviors will improve Ability to verbalize feelings will improve Ability to disclose and discuss suicidal ideas Ability to demonstrate self-control will improve Ability to identify and develop effective coping behaviors will improve  Medication Management: Evaluate patient's response, side effects, and tolerance of medication regimen.  Therapeutic Interventions: 1 to 1 sessions, Unit Group sessions and Medication administration.  Evaluation of Outcomes: Adequate for Discharge   RN Treatment Plan for Primary Diagnosis: MDD (major depressive disorder), recurrent episode, severe (HCC) Long Term Goal(s): Knowledge of disease and therapeutic regimen to maintain health will improve  Short Term Goals: Ability to remain free from injury will improve, Ability to disclose and discuss suicidal ideas, Ability to identify and develop effective coping behaviors will improve and Compliance with prescribed medications will improve  Medication Management: RN will administer medications as ordered by provider, will assess and evaluate patient's response and provide education to patient for prescribed medication. RN will report any adverse and/or side effects to prescribing provider.  Therapeutic Interventions: 1 on 1 counseling sessions, Psychoeducation, Medication administration, Evaluate  responses to treatment, Monitor vital signs and CBGs as ordered, Perform/monitor CIWA, COWS, AIMS and Fall Risk screenings as ordered, Perform  wound care treatments as ordered.  Evaluation of Outcomes: Adequate for Discharge   LCSW Treatment Plan for Primary Diagnosis: MDD (major depressive disorder), recurrent episode, severe (HCC) Long Term Goal(s): Safe transition to appropriate next level of care at discharge, Engage patient in therapeutic group addressing interpersonal concerns.  Short Term Goals: Engage patient in aftercare planning with referrals and resources, Increase social support, Increase emotional regulation, Identify triggers associated with mental health/substance abuse issues and Increase skills for wellness and recovery  Therapeutic Interventions: Assess for all discharge needs, 1 to 1 time with Social worker, Explore available resources and support systems, Assess for adequacy in community support network, Educate family and significant other(s) on suicide prevention, Complete Psychosocial Assessment, Interpersonal group therapy.  Evaluation of Outcomes: Adequate for Discharge   Progress in Treatment :  Attending groups: Yes  Participating in groups: Yes  Taking medication as prescribed: Yes, MD continuing to assess for appropriate medication regimen  Toleration medication: Yes  Family/Significant other contact made: Yes, CSW has spoken with mother  Patient understands diagnosis: Yes  Discussing patient identified problems/goals with staff: Yes  Medical problems stabilized or resolved: Yes  Denies suicidal/homicidal ideation: Yes, denies  Issues/concerns per patient self-inventory: None reported  Other: N/A  New problem(s) identified: None reported at this time    New Short Term/Long Term Goal(s): None at this time    Discharge Plan or Barriers: Patient plans to return home to follow up with outpatient services.     Reason for Continuation of Hospitalization: Anxiety Depression Medication stabilization Suicidal Ideations Withdrawal symptoms  Estimated Length of Stay: Discharge  anticipated for today 08/22/16    Attendees:  Patient:   Physician: Dr. Mckinley Jewelates, Dr. Jama Flavorsobos, MD  08/22/2016   9:30am  Nursing: Caryl AspJan Wright, Karen Shugart, Joslyn Devonaroline Beaudry, RN 08/22/2016 9:30am  RN Care Manager: Onnie BoerJennifer Clark, CM  08/22/2016 9:30am  Social Workers: Chad CordialLauren Carter, LCSW, Samuella BruinKristin Johsua Shevlin, LCSW, [place] Cross[place]ather Smart, LCSW   08/22/2016 9:30am  Nurse Pratictioners: Gray BernhardtMay Augustin, NP, Armandina StammerAgnes Nwoko, NP 08/22/2016 9:30am  Other:  08/22/2016 9:30am    Scribe for Treatment Team: Samuella BruinKristin Jeniece Hannis, LCSW Clinical Social Worker [facility] [phone]

## 2016-08-22 NOTE — [facility] Suicide Risk Assessment (Signed)
[facility]   Principal Problem: MDD (major depressive disorder), recurrent episode, severe (HCC) Discharge Diagnoses:  Patient Active Problem List   Diagnosis Date Noted  . MDD (major depressive disorder), recurrent episode, severe (HCC) [F33.2] 08/19/2016    Total Time spent with patient: 30 minutes  Musculoskeletal: Strength & Muscle Tone: within normal limits Gait & Station: normal Patient leans: N/A  Psychiatric Specialty Exam: ROS  Blood pressure 105/65, pulse (!) 122, temperature 98.4 F (36.9 C), temperature source Oral, resp. rate 16, height 5' 5.25" (1.657 m), weight 175 lb (79.4 kg), last menstrual period 08/04/2016.Body mass index is 28.9 kg/m.  General Appearance: Well Groomed  Eye Contact::  Good  Speech:  Normal Rate409  Volume:  Normal  Mood:  improved and currently minimizes depression  Affect:  Appropriate and reactive   Thought Process:  Linear  Orientation:  Full (Time, Place, and Person)  Thought Content:  no hallucinations, no delusions   Suicidal Thoughts:  No  Homicidal Thoughts:  No  Memory:  recent and remote grossly intact  Judgement:  Other:  improved   Insight:  improved  Psychomotor Activity:  Normal  Concentration:  Good  Recall:  Good  Fund of Knowledge:Good  Language: Good  Akathisia:  Negative  Handed:  Right  AIMS (if indicated):     Assets:  Communication Skills Resilience  Sleep:  Number of Hours: 6.75  Cognition: WNL  ADL's:  Intact   Mental Status Per Nursing Assessment::   On Admission:     Demographic Factors:  20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, Archivistcollege student, 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   Loss Factors: Recent break up with BF, academic pressure  Historical Factors: History of episodes of self cutting, history of depression, no prior psychiatric admissions, no prior suicide attempts   Risk Reduction Factors:   Sense of responsibility to family, Living with another person, especially a relative and  Positive coping skills or problem solving skills  Continued Clinical Symptoms:  At this time patient is improved compared to admission, with improved mood and fuller range of affect, no thought disorder, no suicidal or self injurious ideations, no homicidal ideations, no hallucinations, no delusions, future oriented. We have reviewed medication side effects- denies side effects at this time- aware of potential increased risk of suicidal ideations early in treatment with antidepressants in young adults Behavior on unit calm  Cognitive Features That Contribute To Risk:  No gross cognitive deficits noted upon discharge. Is alert , attentive, and oriented x 3   Suicide Risk:  Mild:  Suicidal ideation of limited frequency, intensity, duration, and specificity.  There are no identifiable plans, no associated intent, mild dysphoria and related symptoms, good self-control (both objective and subjective assessment), few other risk factors, and identifiable protective factors, including available and accessible social support.  Follow-up Information    [facility] Follow up.   Why:  Therapy appt with Jari PiggKelly Joyce on Thursday 12/14 at 9am. Medication managment appt with Milana Obeyeresa Francis at 2:45pm. Please bring insurance card to appt and call office if you need to reschedule.  Contact information: [address] [place], [place] [address]  [phone]          Plan Of Care/Follow-up recommendations:  Activity:  as tolerated  Diet:  regular Tests:  NA Other:  see below  Patient is leaving unit in good spirits Follow up as above   Nehemiah MassedOBOS, FERNANDO, MD 08/22/2016, 12:46 PM

## 2016-08-22 NOTE — Progress Notes (Signed)
Discharge note:  Patient discharged home per MD order.  Patient received all personal belongings from locker and unit.  Patient will follow up with the [facility].  Reviewed AVS/transition record with patient and she indicated understanding.  Patient denies any thoughts of self harm.  Patient left ambulatory and in good spirits.  She had her own transportation here.

## 2016-08-22 NOTE — Progress Notes (Signed)
  [facility] :  Will you be returning to the same living situation after discharge:  Yes,  patient plans to return home At discharge, do you have transportation home?: Yes,  friend/family will pick up Do you have the ability to pay for your medications: Yes,  patient will be provided with prescriptions at discharge  Release of information consent forms completed and in the chart;  Patient's signature needed at discharge.  Patient to Follow up at: Follow-up Information    [facility] Follow up.   Why:  Therapy appt with Jari PiggKelly Joyce on Thursday 12/14 at 9am. Medication managment appt with Milana Obeyeresa Francis at 2:45pm. Please bring insurance card to appt and call office if you need to reschedule.  Contact information: [address] [place], [place] [address]  [phone]          Next level of care provider has [facility] Link:no  Safety Planning and Suicide Prevention discussed: Yes,  with patient and mother  Have you used any form of tobacco in the last 30 days? (Cigarettes, Smokeless Tobacco, Cigars, and/or Pipes): No  Has patient been referred to the Quitline?: N/A patient is not a smoker  Patient has been referred for addiction treatment: Yes  Melinda Levine 08/22/2016, 10:31 AM

## 2016-12-04 ENCOUNTER — Other Ambulatory Visit: Payer: Self-pay | Admitting: Family

## 2016-12-04 DIAGNOSIS — R1011 Right upper quadrant pain: Secondary | ICD-10-CM

## 2016-12-10 ENCOUNTER — Other Ambulatory Visit: Payer: Self-pay | Admitting: Family

## 2016-12-12 ENCOUNTER — Other Ambulatory Visit: Payer: Self-pay

## 2016-12-13 ENCOUNTER — Ambulatory Visit
Admission: RE | Admit: 2016-12-13 | Discharge: 2016-12-13 | Disposition: A | Discharge status: Home / Self Care | Payer: BLUE CROSS/BLUE SHIELD | Source: Ambulatory Visit | Attending: Family | Admitting: Family

## 2016-12-13 DIAGNOSIS — R1011 Right upper quadrant pain: Secondary | ICD-10-CM

## 2017-11-03 IMAGING — US US ABDOMEN LIMITED
1 series · 14 of 25 positions shown · non-contrast
Comparison: None.

CLINICAL DATA: Right upper quadrant abdominal pain.

EXAM:
US ABDOMEN LIMITED - RIGHT UPPER QUADRANT

[Series 1: us abdomen limited · 0.22mm/px · 14 of 46 slices shown]
[im 1/46]
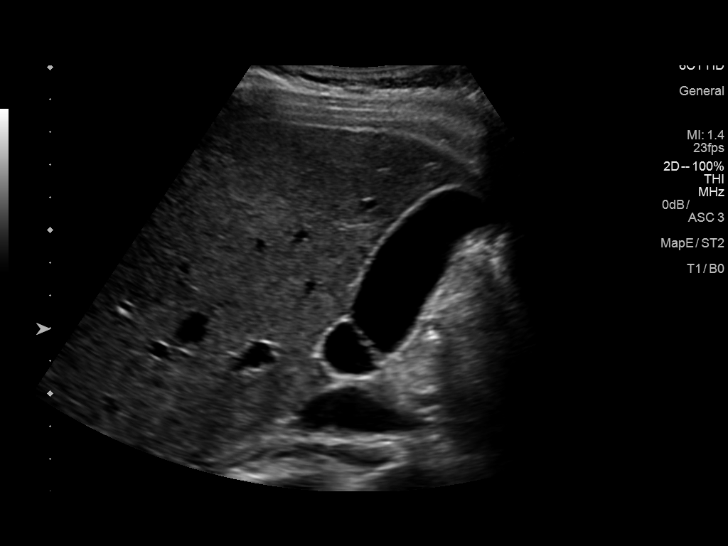
[im 4/46]
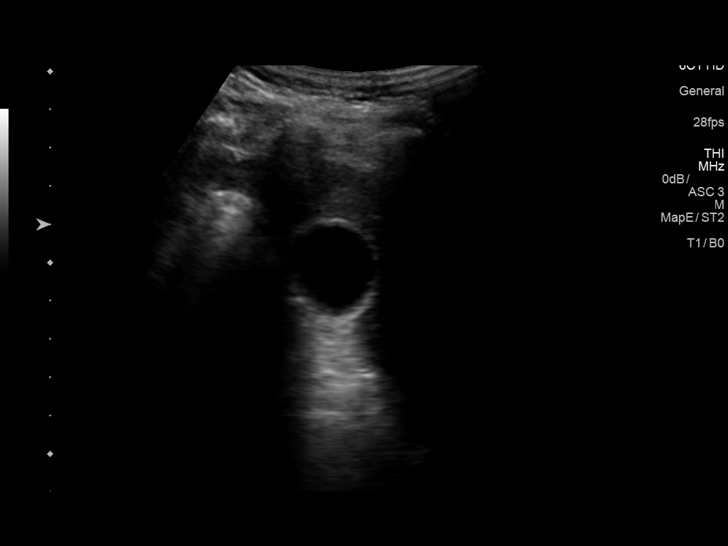
[im 8/46]
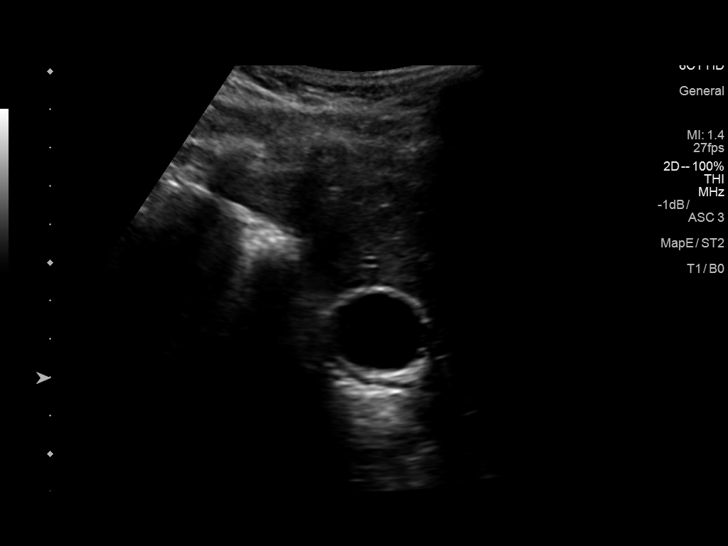
[im 12/46]
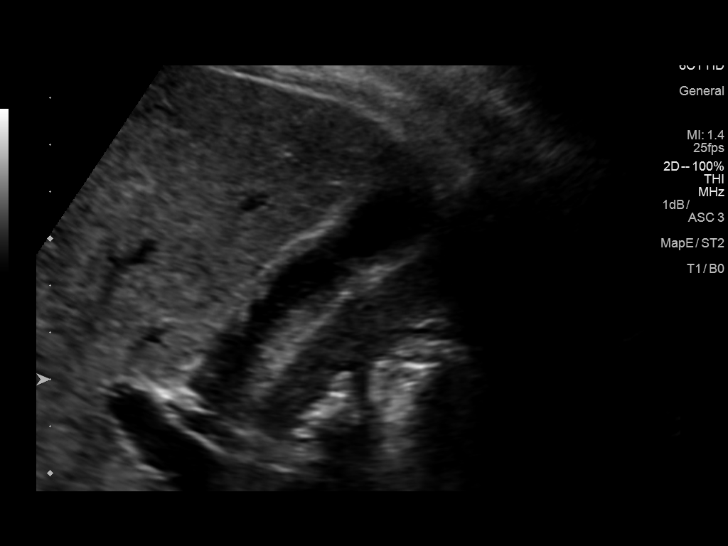
[im 16/46]
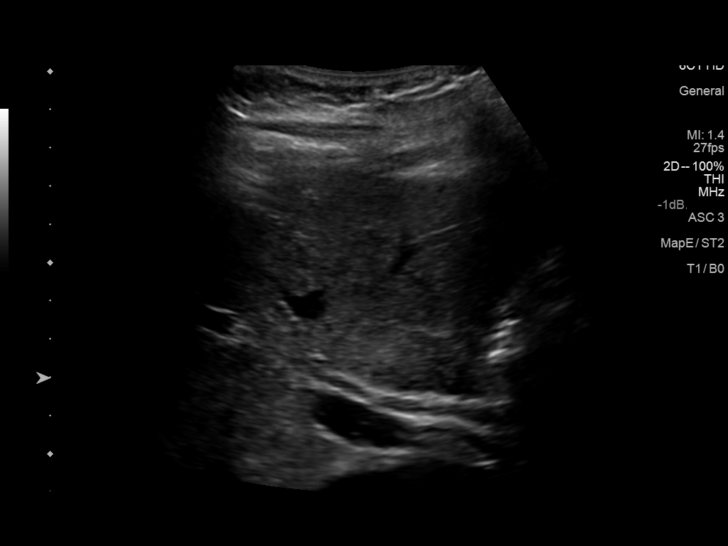
[im 17/46]
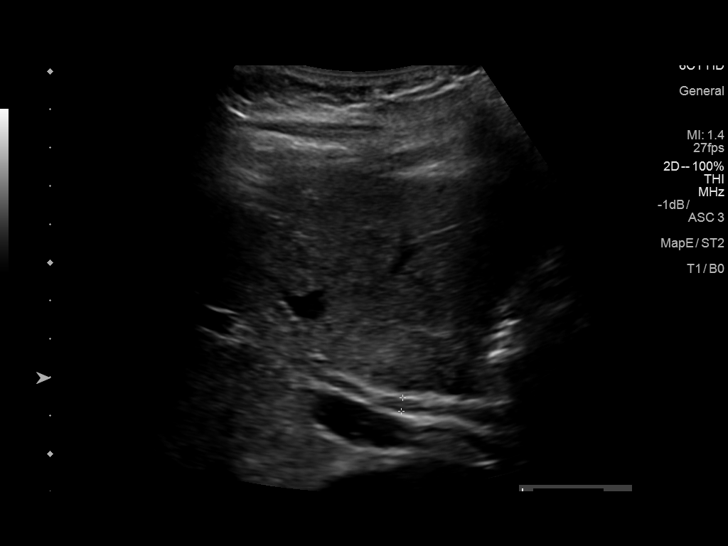
[im 21/46]
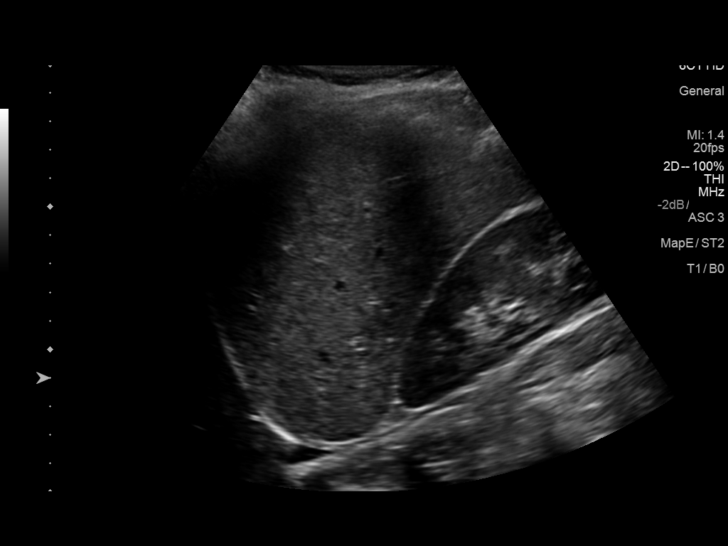
[im 25/46]
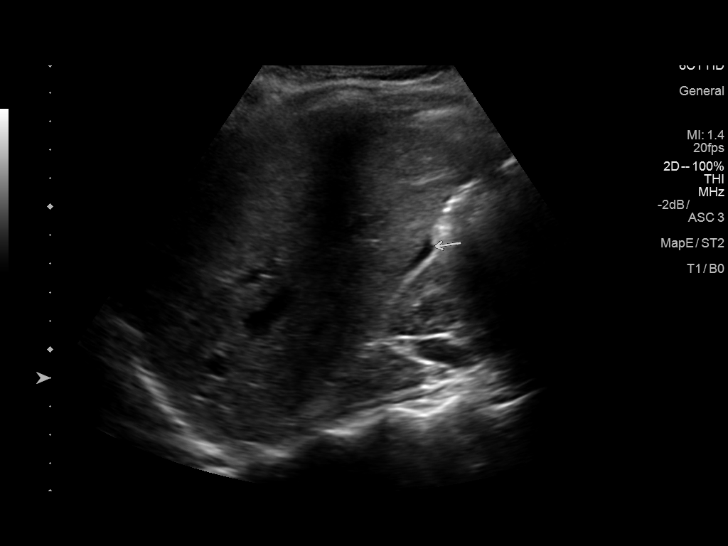
[im 29/46]
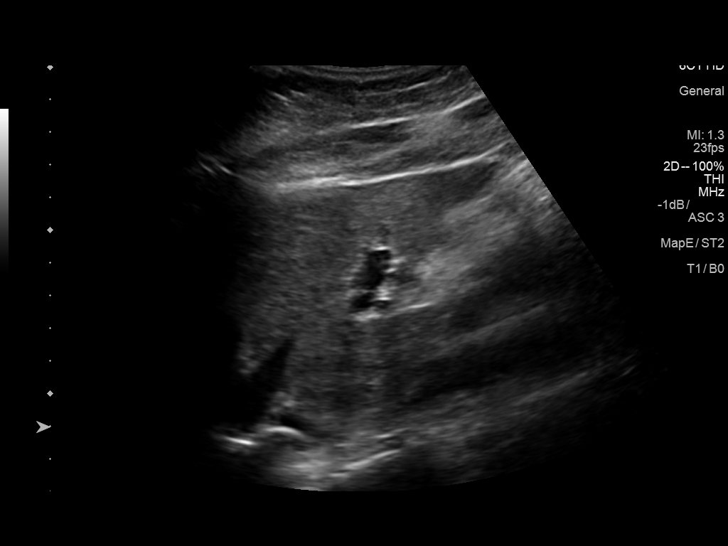
[im 31/46]
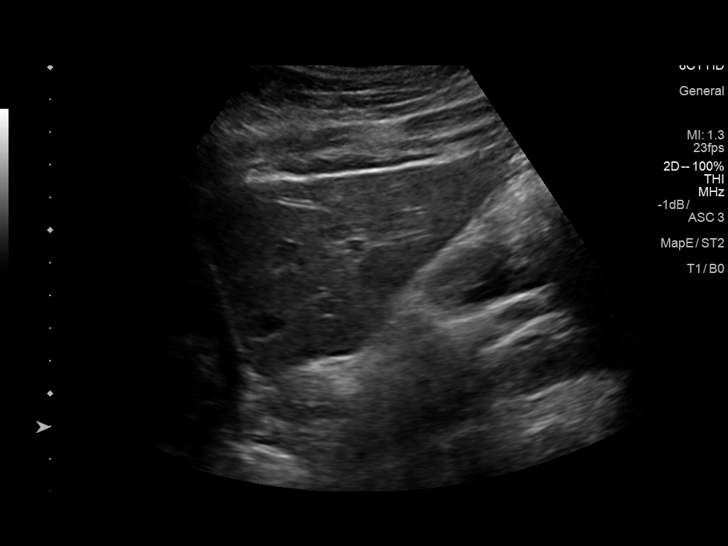
[im 34/46]
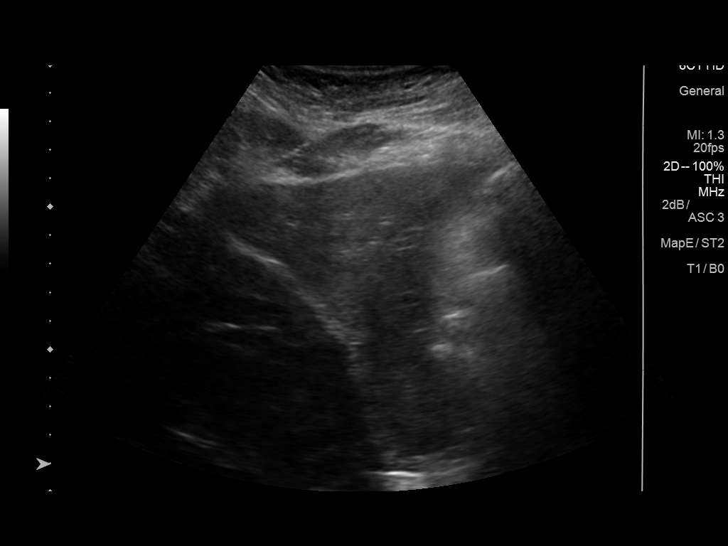
[im 38/46]
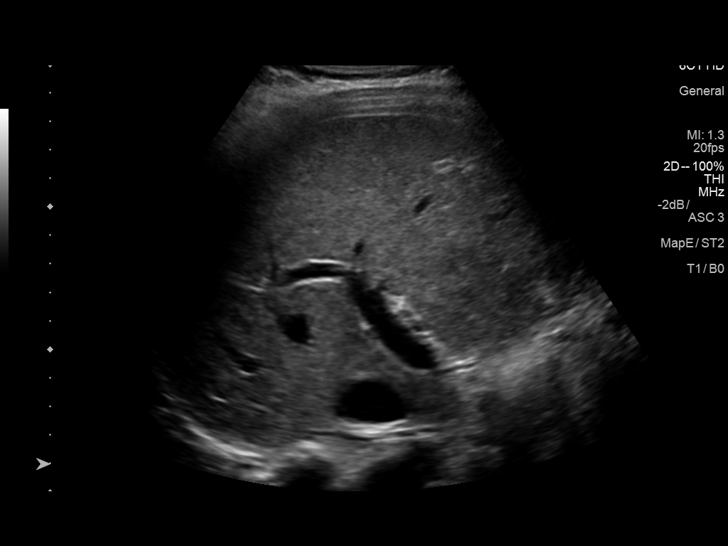
[im 42/46]
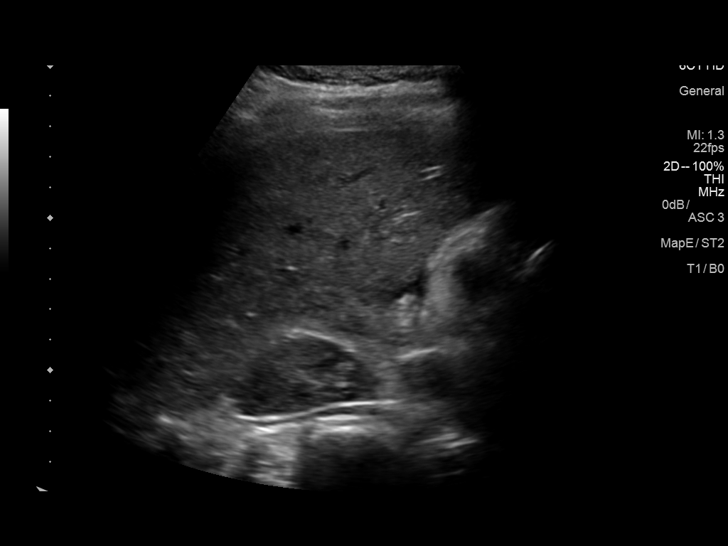
[im 46/46]
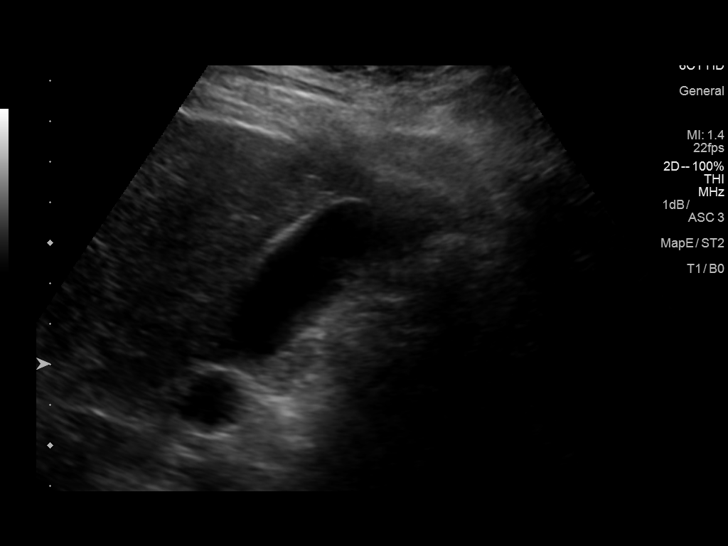

[14 of 25 positions shown; findings below may reference images not displayed]

FINDINGS: Gallbladder:

No gallstones or wall thickening visualized. No sonographic Murphy
sign noted by sonographer. Multiple polyps are noted, with the
largest measuring 2.3 mm.

Common bile duct:

Diameter: 3.6 mm which is within normal limits.

Liver:

No focal lesion identified. Within normal limits in parenchymal
echogenicity.
IMPRESSION: Small gallbladder polyps. No other significant abnormality seen in
the right upper quadrant of the abdomen.
